# Patient Record
Sex: Female | Born: 1950 | Race: White | Hispanic: No | Marital: Married
Health system: Southern US, Community
[De-identification: ages and names within clinical notes are randomized; demographics above are authoritative.]

## PROBLEM LIST (undated history)

## (undated) DIAGNOSIS — R569 Unspecified convulsions: Secondary | ICD-10-CM

## (undated) DIAGNOSIS — Z9889 Other specified postprocedural states: Secondary | ICD-10-CM

## (undated) DIAGNOSIS — R51 Headache: Secondary | ICD-10-CM

## (undated) DIAGNOSIS — M1990 Unspecified osteoarthritis, unspecified site: Secondary | ICD-10-CM

## (undated) DIAGNOSIS — I4891 Unspecified atrial fibrillation: Secondary | ICD-10-CM

## (undated) DIAGNOSIS — R112 Nausea with vomiting, unspecified: Secondary | ICD-10-CM

## (undated) HISTORY — PX: ROTATOR CUFF REPAIR: SHX139

## (undated) HISTORY — DX: Unspecified osteoarthritis, unspecified site: M19.90

## (undated) HISTORY — PX: OTHER SURGICAL HISTORY: SHX169

## (undated) HISTORY — DX: Unspecified atrial fibrillation: I48.91

---

## 2003-05-06 ENCOUNTER — Other Ambulatory Visit: Admission: RE | Admit: 2003-05-06 | Discharge: 2003-05-06 | Payer: Self-pay | Admitting: Family Medicine

## 2003-05-21 ENCOUNTER — Encounter: Payer: Self-pay | Admitting: Family Medicine

## 2003-05-21 ENCOUNTER — Encounter: Admission: RE | Admit: 2003-05-21 | Discharge: 2003-05-21 | Payer: Self-pay | Admitting: Family Medicine

## 2004-05-05 ENCOUNTER — Other Ambulatory Visit: Admission: RE | Admit: 2004-05-05 | Discharge: 2004-05-05 | Payer: Self-pay | Admitting: Family Medicine

## 2004-05-23 ENCOUNTER — Encounter: Admission: RE | Admit: 2004-05-23 | Discharge: 2004-05-23 | Payer: Self-pay | Admitting: Family Medicine

## 2004-08-07 ENCOUNTER — Ambulatory Visit (HOSPITAL_COMMUNITY): Admission: RE | Admit: 2004-08-07 | Discharge: 2004-08-07 | Payer: Self-pay | Admitting: Gastroenterology

## 2004-12-11 ENCOUNTER — Encounter: Admission: RE | Admit: 2004-12-11 | Discharge: 2004-12-11 | Payer: Self-pay | Admitting: Family Medicine

## 2005-01-15 ENCOUNTER — Encounter: Admission: RE | Admit: 2005-01-15 | Discharge: 2005-02-08 | Payer: Self-pay | Admitting: Neurosurgery

## 2005-07-05 ENCOUNTER — Encounter: Admission: RE | Admit: 2005-07-05 | Discharge: 2005-07-05 | Payer: Self-pay | Admitting: Family Medicine

## 2005-09-05 ENCOUNTER — Other Ambulatory Visit: Admission: RE | Admit: 2005-09-05 | Discharge: 2005-09-05 | Payer: Self-pay | Admitting: Family Medicine

## 2006-07-30 ENCOUNTER — Encounter: Admission: RE | Admit: 2006-07-30 | Discharge: 2006-07-30 | Payer: Self-pay | Admitting: Family Medicine

## 2007-06-03 ENCOUNTER — Encounter: Admission: RE | Admit: 2007-06-03 | Discharge: 2007-06-03 | Payer: Self-pay | Admitting: Family Medicine

## 2007-08-18 ENCOUNTER — Encounter: Admission: RE | Admit: 2007-08-18 | Discharge: 2007-08-18 | Payer: Self-pay | Admitting: Family Medicine

## 2007-09-05 ENCOUNTER — Ambulatory Visit (HOSPITAL_COMMUNITY): Admission: RE | Admit: 2007-09-05 | Discharge: 2007-09-05 | Payer: Self-pay | Admitting: Neurosurgery

## 2007-09-09 ENCOUNTER — Other Ambulatory Visit: Admission: RE | Admit: 2007-09-09 | Discharge: 2007-09-09 | Payer: Self-pay | Admitting: Family Medicine

## 2007-12-17 ENCOUNTER — Observation Stay (HOSPITAL_COMMUNITY): Admission: RE | Admit: 2007-12-17 | Discharge: 2007-12-18 | Payer: Self-pay | Admitting: Neurosurgery

## 2008-06-20 ENCOUNTER — Encounter: Admission: RE | Admit: 2008-06-20 | Discharge: 2008-06-20 | Payer: Self-pay | Admitting: Neurosurgery

## 2008-07-07 ENCOUNTER — Encounter: Admission: RE | Admit: 2008-07-07 | Discharge: 2008-07-07 | Payer: Self-pay | Admitting: Family Medicine

## 2008-07-15 ENCOUNTER — Encounter: Admission: RE | Admit: 2008-07-15 | Discharge: 2008-07-15 | Payer: Self-pay | Admitting: Family Medicine

## 2008-08-09 ENCOUNTER — Encounter: Admission: RE | Admit: 2008-08-09 | Discharge: 2008-08-09 | Payer: Self-pay | Admitting: Orthopedic Surgery

## 2008-08-18 ENCOUNTER — Encounter: Admission: RE | Admit: 2008-08-18 | Discharge: 2008-08-18 | Payer: Self-pay | Admitting: Family Medicine

## 2008-08-23 ENCOUNTER — Encounter: Admission: RE | Admit: 2008-08-23 | Discharge: 2008-08-23 | Payer: Self-pay | Admitting: Orthopedic Surgery

## 2008-09-03 ENCOUNTER — Encounter: Admission: RE | Admit: 2008-09-03 | Discharge: 2008-09-03 | Payer: Self-pay | Admitting: Family Medicine

## 2008-09-20 ENCOUNTER — Other Ambulatory Visit: Admission: RE | Admit: 2008-09-20 | Discharge: 2008-09-20 | Payer: Self-pay | Admitting: Family Medicine

## 2009-03-10 ENCOUNTER — Encounter: Admission: RE | Admit: 2009-03-10 | Discharge: 2009-03-10 | Payer: Self-pay | Admitting: Orthopedic Surgery

## 2009-03-23 ENCOUNTER — Encounter: Admission: RE | Admit: 2009-03-23 | Discharge: 2009-03-23 | Payer: Self-pay | Admitting: Orthopedic Surgery

## 2009-04-04 ENCOUNTER — Encounter: Admission: RE | Admit: 2009-04-04 | Discharge: 2009-04-04 | Payer: Self-pay | Admitting: Orthopedic Surgery

## 2009-09-20 ENCOUNTER — Other Ambulatory Visit: Admission: RE | Admit: 2009-09-20 | Discharge: 2009-09-20 | Payer: Self-pay | Admitting: Family Medicine

## 2009-09-29 ENCOUNTER — Encounter: Admission: RE | Admit: 2009-09-29 | Discharge: 2009-09-29 | Payer: Self-pay | Admitting: Family Medicine

## 2009-12-03 ENCOUNTER — Ambulatory Visit: Payer: Self-pay | Admitting: Diagnostic Radiology

## 2009-12-03 ENCOUNTER — Emergency Department (HOSPITAL_BASED_OUTPATIENT_CLINIC_OR_DEPARTMENT_OTHER): Admission: EM | Admit: 2009-12-03 | Discharge: 2009-12-03 | Payer: Self-pay | Admitting: Emergency Medicine

## 2010-05-24 ENCOUNTER — Ambulatory Visit: Payer: Self-pay | Admitting: Vascular Surgery

## 2010-05-24 ENCOUNTER — Ambulatory Visit (HOSPITAL_COMMUNITY): Admission: RE | Admit: 2010-05-24 | Discharge: 2010-05-24 | Payer: Self-pay | Admitting: Neurosurgery

## 2010-09-21 ENCOUNTER — Other Ambulatory Visit: Admission: RE | Admit: 2010-09-21 | Discharge: 2010-09-21 | Payer: Self-pay | Admitting: Family Medicine

## 2011-01-22 ENCOUNTER — Encounter: Payer: Self-pay | Admitting: Family Medicine

## 2011-05-15 NOTE — Op Note (Signed)
NAMESOTIRIA, KEAST NO.:  [id number]   MEDICAL RECORD NO.:  [id number]          PATIENT TYPE:  INP   LOCATION:  3536                         FACILITY:  [facility]   PHYSICIAN:  Cristi Loron, M.D.DATE OF BIRTH:  09/10/1951   DATE OF PROCEDURE:  12/17/2007  DATE OF DISCHARGE:  12/18/2007                               OPERATIVE REPORT   BRIEF HISTORY:  The patient is a 60 year old white female who suffered  from neck and arm pain.  She failed medical management, was worked up  with a cervical MRI and myelo-CT, which demonstrated the patient had  spondylosis/stenosis at C4-5 and C5-6.  I discussed the various  treatment options with the patient and her family, including surgery.  She has weighed the risks, benefits, and alternatives of the surgery.  She opted to pursue with the C4-5 and C5-6 anterior cervical diskectomy,  fusion, and plating.   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   PROCEDURE:  C4-5 and C5-6 extensive anterior cervical  diskectomy/decompression; C4-5, C5-6 anterior interbody arthrodesis with  local autograft bone and VITOSS bone-graft extender; the insertion of C4-  5, C5-6 interbody prosthesis (Alphatec PEEK interbody prosthesis); C4 to  C6 anterior cervical plating with Codman Slim-Loc titanium plate and  screws.   SURGEON:  Dr. Tressie Stalker.   ASSISTANT:  Dr. Trey Sailors.   ANESTHESIA:  General endotracheal.   ESTIMATED BLOOD LOSS:  100 mL.   SPECIMENS:  None.   DRAINS:  None.   COMPLICATIONS:  None.   PROCEDURE:  The patient was brought to operating by anesthesia team.  General endotracheal anesthesia was induced.  The patient remained in  the supine position.  A roll was placed under her shoulders to place her  neck in slight  extension.  The anterior cervical region was then  prepared with Betadine scrub and Betadine solution.  Sterile drapes were  applied.  I then injected the area to be incised with Marcaine with  epinephrine solution.  I used a scalpel to make a transverse incision in  the patient's left anterior neck.  I used the Metzenbaum scissors to  divide the platysma muscle and then to dissect medial to  sternocleidomastoid muscle, jugular vein, and carotid artery.  I  carefully dissected down towards the anterior cervical spine, identified  the esophagus, retracted it medially.  I then used the Kitner swabs to  clear the soft tissue from the anterior cervical spine and inserted a  bent spinal needle into the upper exposed intervertebral disk space.  I  obtained intraoperative radiograph to confirm our location.   We then used electrocautery to detach the medial border of the longus  colli muscle bilaterally from the C4-5 and C5-6 intervertebral disk  space.  I inserted the Caspar self-retaining retractor for exposure.  We  began at C4-5; I incised the C4-5 intervertebral disk with a 15-blade  scalpel and performed partial intervertebral diskectomy with a pituitary  forceps.  The  disk space was collapsed and spondylotic.  I then inserted  distraction screws to C4 and C5 and then brought the operative  microscope into the field.  Under image magnification illumination, we  completed the decompression.  We used a high-speed drill to decorticate  the vertebral endplates at C4-5, drilled away the remainder C4-5  intervertebral disk, and then drilled away some posterior spondylosis  and thinned out the posterior longitudinal ligament, and incised the  ligament with arachnoid knife and removed it with Kerrison punch,  undercutting a vertebral endplates at C4-5, decompressing thecal sac.  I  performed a foraminotomy about the bilateral C5 nerve root completing  the decompression at this level.   I then  repeated this procedure in an analogous fashion at C5-6,  decompressing the C5-6 thecal sac and bilateral C6 nerve roots.   Having completed the decompression, we now turned our attention to  arthrodesis.  We used trial spacers and determined to use a 5-mm small  prosthesis at C5-6 and a 6-mm prosthesis at C4-5.  We prefilled these  small Alphatec PEEK interbody prostheses with local autograft bone we  obtained during decompression as well as VITOSS bone-graft extender.  We  then inserted the prosthesis and distracted the interspaces and then  removed the distraction screws.  There was a good snug fit of the  prosthesis at both levels.   We now turned our attention to the anterior instrumentation.  We used a  high-speed drill to remove some ventral spondylosis from the vertebral  endplates at C4-5 and C5-6, so that the plate would lay down flat.  We  selected appropriate length Codman Slim-Loc anterior cervical plate and  laid along the anterior aspect of vertebral bodies from C4 to C6.  We  then drilled two 14-mm holes at C4, C5, and C6 and then secured the  plates to the vertebral bodies by placing two 12-mm self-tapping screws  at C4, C5, and C6.  We then obtained intraoperative radiograph  demonstrating good position of plate, screws, and interbody prosthesis.  We therefore secured the screws to the plate by locking each cam.   We then obtained hemostasis using bipolar electrocautery.  We irrigated  the wound out with bacitracin solution.  We then removed the retractor.  We inspected the esophagus for any damage, there was none apparent.  We  then reapproximated the patient's platysma muscle with interrupted 3-0  Vicryl suture, subcutaneous tissue with interrupted 3-0 Vicryl suture,  and the skin with Steri-Strips and Benzoin.  The wound was then coated  with bacitracin ointment.  A sterile dressing was applied.  Drapes were  removed.  The patient was subsequently extubated by the  anesthesia team  and transported to the post anesthesia care unit in stable condition.  All sponge, instrument, and needle counts were correct at the end of  this case.      Cristi Loron, M.D.  Electronically Signed     JDJ/MEDQ  D:  12/18/2007  T:  12/19/2007  Job:  [id number]

## 2011-05-18 NOTE — Op Note (Signed)
NAME:  Allison Burnett, Allison Burnett                   ACCOUNT NO.:  [id number]   MEDICAL RECORD NO.:  [id number]                   PATIENT TYPE:  AMB   LOCATION:  ENDO                                 FACILITY:  [facility]   PHYSICIAN:  Danise Edge, M.D.                DATE OF BIRTH:  10-18-1951   DATE OF PROCEDURE:  08/07/2004  DATE OF DISCHARGE:                                 OPERATIVE REPORT   PROCEDURE:  Screening colonoscopy.   PROCEDURE INDICATION:  [name] is a 60 year old female,  born 04-16-1951.  Approximately 5 years ago, Ms. Colon underwent a  diagnostic colonoscopy to evaluate iron deficiency anemia and a positive  family history of colon polyps in her brother and other.  A 3 mm polyp was  removed from the sigmoid colon.  [name] was advised to have a repeat  colonoscopy in 2005.   ENDOSCOPIST:  Danise Edge, M.D.   PREMEDICATION:  1. Versed 7 mg.  2. Demerol 50 mg.   DESCRIPTION OF PROCEDURE:  After obtaining informed consent, [name] was  placed in the left lateral decubitus position.  I administered intravenous  Demerol and intravenous Versed to achieve conscious sedation for the  procedure.  The patient's blood pressure, oxygen saturation, and cardiac  rhythm were monitored throughout the procedure and documented in the medical  record.   Anal inspection and digital rectal exam were normal.  The Olympus adjustable  pediatric colonoscope was introduced into the rectum and advanced to the  cecum.  Colonic preparation for the exam today was excellent.   RECTUM:  Normal.  SIGMOID COLON AND DESCENDING COLON:  Normal.  SPLENIC FLEXURE:  Normal.  TRANSVERSE COLON:  Normal.  HEPATIC FLEXURE:  Normal.  ASCENDING COLON:  Normal.  CECUM AND ILEOCECAL VALVE:  Normal.   ASSESSMENT:  Normal proctocolonoscopy to the cecum.   RECOMMENDATIONS:  Repeat colonoscopy in August 2010.                                               Danise Edge,  M.D.    MJ/MEDQ  D:  08/07/2004  T:  08/07/2004  Job:  [id number]   cc:   Gretta Arab. Valentina Lucks, M.D.  [address] [place]  [place] [address]  Fax: [phone]

## 2011-10-05 LAB — BASIC METABOLIC PANEL
BUN: 7
CO2: 26
Calcium: 9.6
Chloride: 107
Creatinine, Ser: 0.72
GFR calc Af Amer: >60
GFR calc non Af Amer: >60
Glucose, Bld: 85
Potassium: 4.9
Sodium: 137

## 2011-10-05 LAB — CBC
MCHC: 34
MCV: 97.6
RBC: 3.77 — ABNORMAL LOW

## 2012-02-07 ENCOUNTER — Other Ambulatory Visit: Payer: Self-pay | Admitting: Orthopedic Surgery

## 2012-02-07 DIAGNOSIS — M25562 Pain in left knee: Secondary | ICD-10-CM

## 2012-02-12 ENCOUNTER — Ambulatory Visit
Admission: RE | Admit: 2012-02-12 | Discharge: 2012-02-12 | Disposition: A | Discharge status: Home / Self Care | Payer: BC Managed Care – PPO | Source: Ambulatory Visit | Attending: Orthopedic Surgery | Admitting: Orthopedic Surgery

## 2012-02-12 DIAGNOSIS — M25562 Pain in left knee: Secondary | ICD-10-CM

## 2012-09-26 ENCOUNTER — Other Ambulatory Visit: Payer: Self-pay | Admitting: Family Medicine

## 2012-09-26 ENCOUNTER — Other Ambulatory Visit (HOSPITAL_COMMUNITY)
Admission: RE | Admit: 2012-09-26 | Discharge: 2012-09-26 | Disposition: A | Discharge status: Home / Self Care | Payer: BC Managed Care – PPO | Source: Ambulatory Visit | Attending: Family Medicine | Admitting: Family Medicine

## 2012-09-26 DIAGNOSIS — Z124 Encounter for screening for malignant neoplasm of cervix: Secondary | ICD-10-CM | POA: Insufficient documentation

## 2013-06-30 HISTORY — PX: ABDOMINAL HYSTERECTOMY: SHX81

## 2014-01-04 DIAGNOSIS — R112 Nausea with vomiting, unspecified: Secondary | ICD-10-CM

## 2014-01-04 DIAGNOSIS — Z9889 Other specified postprocedural states: Secondary | ICD-10-CM

## 2014-01-04 HISTORY — DX: Nausea with vomiting, unspecified: R11.2

## 2014-01-04 HISTORY — DX: Other specified postprocedural states: Z98.890

## 2014-02-09 ENCOUNTER — Other Ambulatory Visit: Payer: Self-pay | Admitting: Gastroenterology

## 2014-02-10 ENCOUNTER — Encounter (HOSPITAL_COMMUNITY): Payer: Self-pay | Admitting: *Deleted

## 2014-02-10 ENCOUNTER — Encounter (HOSPITAL_COMMUNITY): Payer: Self-pay | Admitting: Pharmacy Technician

## 2014-04-07 ENCOUNTER — Encounter (HOSPITAL_COMMUNITY): Payer: Self-pay | Admitting: *Deleted

## 2014-05-19 ENCOUNTER — Encounter (HOSPITAL_COMMUNITY): Payer: Self-pay | Admitting: *Deleted

## 2014-05-31 ENCOUNTER — Other Ambulatory Visit: Payer: Self-pay | Admitting: Gastroenterology

## 2014-06-01 ENCOUNTER — Encounter (HOSPITAL_COMMUNITY)
Admission: RE | Disposition: A | Discharge status: Home / Self Care | Payer: Self-pay | Source: Ambulatory Visit | Attending: Gastroenterology

## 2014-06-01 ENCOUNTER — Encounter (HOSPITAL_COMMUNITY): Payer: Self-pay

## 2014-06-01 ENCOUNTER — Ambulatory Visit (HOSPITAL_COMMUNITY)
Admission: RE | Admit: 2014-06-01 | Discharge: 2014-06-01 | Disposition: A | Discharge status: Home / Self Care | Payer: BC Managed Care – PPO | Source: Ambulatory Visit | Attending: Gastroenterology | Admitting: Gastroenterology

## 2014-06-01 ENCOUNTER — Ambulatory Visit (HOSPITAL_COMMUNITY): Payer: BC Managed Care – PPO | Admitting: Anesthesiology

## 2014-06-01 ENCOUNTER — Encounter (HOSPITAL_COMMUNITY): Payer: BC Managed Care – PPO | Admitting: Anesthesiology

## 2014-06-01 DIAGNOSIS — M810 Age-related osteoporosis without current pathological fracture: Secondary | ICD-10-CM | POA: Insufficient documentation

## 2014-06-01 DIAGNOSIS — Z1211 Encounter for screening for malignant neoplasm of colon: Secondary | ICD-10-CM | POA: Insufficient documentation

## 2014-06-01 DIAGNOSIS — L719 Rosacea, unspecified: Secondary | ICD-10-CM | POA: Insufficient documentation

## 2014-06-01 DIAGNOSIS — G2581 Restless legs syndrome: Secondary | ICD-10-CM | POA: Insufficient documentation

## 2014-06-01 DIAGNOSIS — Z888 Allergy status to other drugs, medicaments and biological substances status: Secondary | ICD-10-CM | POA: Insufficient documentation

## 2014-06-01 DIAGNOSIS — Z882 Allergy status to sulfonamides status: Secondary | ICD-10-CM | POA: Diagnosis not present

## 2014-06-01 DIAGNOSIS — M1990 Unspecified osteoarthritis, unspecified site: Secondary | ICD-10-CM | POA: Insufficient documentation

## 2014-06-01 HISTORY — DX: Headache: R51

## 2014-06-01 HISTORY — DX: Nausea with vomiting, unspecified: R11.2

## 2014-06-01 HISTORY — DX: Unspecified convulsions: R56.9

## 2014-06-01 HISTORY — PX: COLONOSCOPY WITH PROPOFOL: SHX5780

## 2014-06-01 HISTORY — DX: Other specified postprocedural states: Z98.890

## 2014-06-01 HISTORY — DX: Unspecified osteoarthritis, unspecified site: M19.90

## 2014-06-01 SURGERY — COLONOSCOPY WITH PROPOFOL
Anesthesia: Monitor Anesthesia Care

## 2014-06-01 SURGERY — COLONOSCOPY
Anesthesia: Moderate Sedation

## 2014-06-01 MED ORDER — PROPOFOL INFUSION 10 MG/ML OPTIME
INTRAVENOUS | Status: DC | PRN
  Administered 2014-06-01: 300 ug/kg/min via INTRAVENOUS

## 2014-06-01 MED ORDER — LACTATED RINGERS IV SOLN
INTRAVENOUS | Status: DC
  Administered 2014-06-01: 11:00:00 via INTRAVENOUS

## 2014-06-01 MED ORDER — PROPOFOL 10 MG/ML IV BOLUS
INTRAVENOUS | Status: AC
  Filled 2014-06-01: qty 60

## 2014-06-01 MED ORDER — PROMETHAZINE HCL 25 MG/ML IJ SOLN: 6.2500 mg | INTRAMUSCULAR | Status: DC | PRN

## 2014-06-01 SURGICAL SUPPLY — 22 items

## 2014-06-01 NOTE — Transfer of Care (Signed)
Immediate Anesthesia Transfer of Care Note  Patient: Allison Burnett  Procedure(s) Performed: Procedure(s): COLONOSCOPY WITH PROPOFOL (N/A)  Patient Location: PACU  Anesthesia Type:MAC  Level of Consciousness: awake and patient cooperative  Airway & Oxygen Therapy: Patient Spontanous Breathing and Patient connected to face mask oxygen  Post-op Assessment: Report given to PACU RN and Post -op Vital signs reviewed and stable  Post vital signs: Reviewed and stable  Complications: No apparent anesthesia complications

## 2014-06-01 NOTE — Anesthesia Postprocedure Evaluation (Signed)
  Anesthesia Post-op Note  Patient: Allison Burnett  Procedure(s) Performed: Procedure(s) (LRB): COLONOSCOPY WITH PROPOFOL (N/A)  Patient Location: PACU  Anesthesia Type: MAC  Level of Consciousness: awake and alert   Airway and Oxygen Therapy: Patient Spontanous Breathing  Post-op Pain: mild  Post-op Assessment: Post-op Vital signs reviewed, Patient's Cardiovascular Status Stable, Respiratory Function Stable, Patent Airway and No signs of Nausea or vomiting  Last Vitals:  Filed Vitals:   06/01/14 1216  BP: 123/83  Pulse: 88  Temp:   Resp: 15    Post-op Vital Signs: stable   Complications: No apparent anesthesia complications

## 2014-06-01 NOTE — Op Note (Signed)
Procedure: Screening colonoscopy. Normal screening colonoscopy performed 02/15/2004  Endoscopist: Danise Edge  Premedication: Propofol administered by anesthesia  Procedure: The patient was placed in the left lateral decubitus position. Anal inspection and digital rectal exam were normal. The Pentax pediatric colonoscope was introduced into the rectum and advanced to the cecum. A normal-appearing ileocecal valve and appendiceal orifice were identified. Colonic preparation for the exam today was good.  Rectum. Normal. Retroflexed view of the distal rectum normal  Sigmoid colon and descending colon. Normal  Splenic flexure. Normal  Transverse colon. Normal  Hepatic flexure. Normal  Ascending colon. Normal  Cecum and ileocecal valve. Normal  Assessment: Normal screening proctocolonoscopy to the cecum  Recommendation: Schedule repeat screening colonoscopy in 10 years

## 2014-06-01 NOTE — H&P (Signed)
  Procedure: Screening colonoscopy. Normal screening colonoscopy performed 02/15/2004.  History: The patient is a 63 year old female born 07/29/1951. She is scheduled to undergo a repeat screening colonoscopy with polypectomy to prevent colon cancer.  Past medical history: Seizures as a child. Osteoarthritis. Restless leg syndrome. Osteoporosis with wrist fracture. Rosacea. Hysterectomy. Cervical spine surgery.  Allergies: Mysoline. Sulfa drugs. Dilantin. Phenobarbital.  Exam: The patient is alert and lying comfortably on the endoscopy stretcher. Lungs are clear to auscultation. Cardiac exam reveals a regular rhythm. Abdomen is soft and nontender to palpation.  Plan: Proceed with screening colonoscopy

## 2014-06-01 NOTE — Anesthesia Preprocedure Evaluation (Signed)
Anesthesia Evaluation  Patient identified by MRN, date of birth, ID band Patient awake    Reviewed: Allergy & Precautions, H&P , NPO status , Patient's Chart, lab work & pertinent test results  Airway Mallampati: II TM Distance: >3 FB Neck ROM: Full    Dental no notable dental hx.    Pulmonary neg pulmonary ROS,  breath sounds clear to auscultation  Pulmonary exam normal       Cardiovascular negative cardio ROS  Rhythm:Regular Rate:Normal     Neuro/Psych negative neurological ROS  negative psych ROS   GI/Hepatic negative GI ROS, Neg liver ROS,   Endo/Other  negative endocrine ROS  Renal/GU negative Renal ROS  negative genitourinary   Musculoskeletal negative musculoskeletal ROS (+)   Abdominal   Peds negative pediatric ROS (+)  Hematology negative hematology ROS (+)   Anesthesia Other Findings   Reproductive/Obstetrics negative OB ROS                           Anesthesia Physical Anesthesia Plan  ASA: I  Anesthesia Plan: MAC   Post-op Pain Management:    Induction: Intravenous  Airway Management Planned: Nasal Cannula  Additional Equipment:   Intra-op Plan:   Post-operative Plan:   Informed Consent: I have reviewed the patients History and Physical, chart, labs and discussed the procedure including the risks, benefits and alternatives for the proposed anesthesia with the patient or authorized representative who has indicated his/her understanding and acceptance.     Plan Discussed with: CRNA and Surgeon  Anesthesia Plan Comments:         Anesthesia Quick Evaluation  

## 2014-06-03 ENCOUNTER — Encounter (HOSPITAL_COMMUNITY): Payer: Self-pay | Admitting: Gastroenterology

## 2015-09-20 ENCOUNTER — Other Ambulatory Visit (HOSPITAL_BASED_OUTPATIENT_CLINIC_OR_DEPARTMENT_OTHER): Payer: Self-pay | Admitting: Podiatry

## 2015-09-20 DIAGNOSIS — M79672 Pain in left foot: Secondary | ICD-10-CM

## 2015-09-24 ENCOUNTER — Ambulatory Visit (HOSPITAL_BASED_OUTPATIENT_CLINIC_OR_DEPARTMENT_OTHER)
Admission: RE | Admit: 2015-09-24 | Discharge: 2015-09-24 | Disposition: A | Discharge status: Home / Self Care | Payer: BLUE CROSS/BLUE SHIELD | Source: Ambulatory Visit | Attending: Podiatry | Admitting: Podiatry

## 2015-09-24 DIAGNOSIS — M79672 Pain in left foot: Secondary | ICD-10-CM | POA: Diagnosis present

## 2016-06-18 ENCOUNTER — Other Ambulatory Visit: Payer: Self-pay | Admitting: Family Medicine

## 2016-06-18 ENCOUNTER — Ambulatory Visit
Admission: RE | Admit: 2016-06-18 | Discharge: 2016-06-18 | Disposition: A | Discharge status: Home / Self Care | Payer: BLUE CROSS/BLUE SHIELD | Source: Ambulatory Visit | Attending: Family Medicine | Admitting: Family Medicine

## 2016-06-18 DIAGNOSIS — R059 Cough, unspecified: Secondary | ICD-10-CM

## 2016-06-18 DIAGNOSIS — R05 Cough: Secondary | ICD-10-CM

## 2016-06-19 ENCOUNTER — Other Ambulatory Visit (HOSPITAL_COMMUNITY): Payer: Self-pay | Admitting: Respiratory Therapy

## 2016-06-19 DIAGNOSIS — J45909 Unspecified asthma, uncomplicated: Secondary | ICD-10-CM

## 2016-06-25 ENCOUNTER — Ambulatory Visit (HOSPITAL_COMMUNITY)
Admission: RE | Admit: 2016-06-25 | Discharge: 2016-06-25 | Disposition: A | Discharge status: Home / Self Care | Payer: BLUE CROSS/BLUE SHIELD | Source: Ambulatory Visit | Attending: Family Medicine | Admitting: Family Medicine

## 2016-06-25 DIAGNOSIS — J45909 Unspecified asthma, uncomplicated: Secondary | ICD-10-CM | POA: Insufficient documentation

## 2016-06-25 LAB — PULMONARY FUNCTION TEST
DL/VA % pred: 103 %
DL/VA: 4.82 ml/min/mmHg/L
DLCO UNC % PRED: 79 %
DLCO UNC: 18.1 ml/min/mmHg
FEF 25-75 PRE: 1.83 L/s
FEF 25-75 Post: 1.67 L/sec
FEF2575-%Change-Post: -9 %
FEF2575-%Pred-Post: 80 %
FEF2575-%Pred-Pre: 89 %
FEV1-%CHANGE-POST: -3 %
FEV1-%Pred-Post: 79 %
FEV1-%Pred-Pre: 82 %
FEV1-POST: 1.85 L
FEV1-Pre: 1.91 L
FEV1FVC-%Change-Post: 0 %
FEV1FVC-%PRED-PRE: 103 %
FEV6-%CHANGE-POST: -3 %
FEV6-%PRED-POST: 78 %
FEV6-%Pred-Pre: 81 %
FEV6-Post: 2.29 L
FEV6-Pre: 2.38 L
FEV6FVC-%CHANGE-POST: 0 %
FEV6FVC-%Pred-Post: 104 %
FEV6FVC-%Pred-Pre: 103 %
FVC-%CHANGE-POST: -4 %
FVC-%Pred-Post: 75 %
FVC-%Pred-Pre: 78 %
FVC-Post: 2.29 L
FVC-Pre: 2.39 L
POST FEV6/FVC RATIO: 100 %
Post FEV1/FVC ratio: 81 %
Pre FEV1/FVC ratio: 80 %
Pre FEV6/FVC Ratio: 100 %
RV % PRED: 101 %
RV: 2.07 L
TLC % pred: 91 %
TLC: 4.49 L

## 2016-06-25 MED ORDER — ALBUTEROL SULFATE (2.5 MG/3ML) 0.083% IN NEBU
2.5000 mg | INHALATION_SOLUTION | Freq: Once | RESPIRATORY_TRACT | Status: AC
  Administered 2016-06-25: 2.5 mg via RESPIRATORY_TRACT

## 2016-08-14 ENCOUNTER — Ambulatory Visit (INDEPENDENT_AMBULATORY_CARE_PROVIDER_SITE_OTHER): Payer: BLUE CROSS/BLUE SHIELD | Admitting: Pulmonary Disease

## 2016-08-14 ENCOUNTER — Other Ambulatory Visit (INDEPENDENT_AMBULATORY_CARE_PROVIDER_SITE_OTHER): Payer: BLUE CROSS/BLUE SHIELD

## 2016-08-14 ENCOUNTER — Encounter: Payer: Self-pay | Admitting: Pulmonary Disease

## 2016-08-14 DIAGNOSIS — R05 Cough: Secondary | ICD-10-CM

## 2016-08-14 DIAGNOSIS — M1990 Unspecified osteoarthritis, unspecified site: Secondary | ICD-10-CM

## 2016-08-14 DIAGNOSIS — I4891 Unspecified atrial fibrillation: Secondary | ICD-10-CM | POA: Insufficient documentation

## 2016-08-14 DIAGNOSIS — Z8669 Personal history of other diseases of the nervous system and sense organs: Secondary | ICD-10-CM | POA: Insufficient documentation

## 2016-08-14 DIAGNOSIS — R059 Cough, unspecified: Secondary | ICD-10-CM

## 2016-08-14 DIAGNOSIS — G40909 Epilepsy, unspecified, not intractable, without status epilepticus: Secondary | ICD-10-CM | POA: Insufficient documentation

## 2016-08-14 DIAGNOSIS — J3081 Allergic rhinitis due to animal (cat) (dog) hair and dander: Secondary | ICD-10-CM | POA: Insufficient documentation

## 2016-08-14 DIAGNOSIS — J302 Other seasonal allergic rhinitis: Secondary | ICD-10-CM

## 2016-08-14 LAB — SEDIMENTATION RATE: Sed Rate: 17 mm/hr (ref 0–30)

## 2016-08-14 LAB — RHEUMATOID FACTOR

## 2016-08-14 LAB — C-REACTIVE PROTEIN: CRP: 1.3 mg/dL (ref 0.5–20.0)

## 2016-08-14 MED ORDER — ALBUTEROL SULFATE HFA 108 (90 BASE) MCG/ACT IN AERS: 2.0000 | INHALATION_SPRAY | RESPIRATORY_TRACT | 1 refills | Status: DC | PRN

## 2016-08-14 MED ORDER — PREDNISONE 20 MG PO TABS: 40.0000 mg | ORAL_TABLET | Freq: Every day | ORAL | 0 refills | Status: DC

## 2016-08-14 MED ORDER — AMOXICILLIN-POT CLAVULANATE 875-125 MG PO TABS: 1.0000 | ORAL_TABLET | Freq: Two times a day (BID) | ORAL | 0 refills | Status: DC

## 2016-08-14 NOTE — Progress Notes (Signed)
Subjective:    Patient ID: Allison Burnett, female    DOB: [birth year], 65 y.o.   MRN: [id number]  HPI Per documentation patient was seen on 06/18/16 with 2 days of cough and chest congestion as well as a sore throat. Patient has a history of chronic cough reportedly treated as reflux with Prilosec without significant improvement. At that time patient was reporting cough is worse at night as well as after eating and with laughter. She was treated at this visit with a prednisone 40 mg course for 3 days as well as doxycycline twice daily for a 10 day course. She reports her cough during this episode she had spontaneous occurrence of a cough and the chest discomfort and sore throat came after the cough started. She report Robitussin-DM was the only thing that seemed to help her cough. She reports she was waking up at night coughing & still does intermittently. She denies any associated wheezing. Husband has reported wheezing while sleeping. She does cough intermittently throughout the day. Cough is primarily nonproductive. She does have dyspnea with significant exertion or lifting. She reports only rarely is her cough triggered by perfumes or strong odors. Denies any reflux or dyspepsia. She denies any morning brash water taste but does have a bad taste with her periodontal disease. She reports a rare pain in her chest that lasts for 3-4 minutes with "tightness" that makes her bend over but then spontaneously resolves. No associated nausea or emesis. No recent syncope or near syncope. She has had previous syncope evaluated by cardiology and found to have atrial fibrillation. She reports chronic pain in her ankles, knees, and wrists. She denies any significant stiffness in her hands. No joint erythema. Denies any breathing problems or chronic bronchitis as a child. She reports she did develop cat allergies with a pregnancy. Reports only mild seasonal allergic rhinitis with post-nasal drainage.   Review of Systems  No rashes or bruising. No fever, chills, or sweats. A pertinent 14 point review of systems is negative except as per the history of presenting illness.  Allergies  Allergen Reactions  . Mysoline [Primidone] Nausea And Vomiting  . Dilantin [Phenytoin Sodium Extended] Rash  . Phenobarbital Rash  . Sulfa Antibiotics Rash    Current Outpatient Prescriptions on File Prior to Visit  Medication Sig Dispense Refill  . Ascorbic Acid (VITAMIN C) 1000 MG tablet Take 1,000 mg by mouth daily.    . Probiotic Product (PHILLIPS COLON HEALTH PO) Take 1 capsule by mouth daily.    Marland Kitchen VITAMIN D, CHOLECALCIFEROL, PO Take 1 capsule by mouth daily.     No current facility-administered medications on file prior to visit.     Past Medical History:  Diagnosis Date  . Arthritis   . Atrial fibrillation ([place])   . DJD (degenerative joint disease)    bulging disc lower back  . Headache(784.0)    migraines  . PONV (postoperative nausea and vomiting) Jan 04, 2014   woke up during attempted colonscopy at [facility]  . Seizures ([place])    none since age 30, childhood only    Past Surgical History:  Procedure Laterality Date  . ABDOMINAL HYSTERECTOMY  06-2013   and bladder tach done  . COLONOSCOPY WITH PROPOFOL N/A 06/01/2014   Procedure: COLONOSCOPY WITH PROPOFOL;  Surgeon: Garlan Fair, MD;  Location: [facility] END[facility]COPY;  Service: Endoscopy;  Laterality: N/A;  . colonscopy     multiple times due to family hx of colon cancer  .  polyps removed from colon  in past  . [place]    . spurs removed from spine  5 yrs ago    Family History  Problem Relation Age of Onset  . Colon cancer Maternal Grandmother   . Emphysema Maternal Grandfather   . Colon cancer Maternal Uncle   . Emphysema Mother   . Stroke Father   . Lung cancer Other     Social History   Social History  . Marital status: Married    Spouse name: N/A  . Number of children: N/A  . Years of education: N/A   Social History Main Topics    . Smoking status: Passive Smoke Exposure - Never Smoker  . Smokeless tobacco: Never Used     Comment: Parents until 84 y.o. as well as other family members  & husband  . Alcohol use Yes     Comment: occasional  . Drug use: No  . Sexual activity: Not Currently   Other Topics Concern  . None   Social History Narrative   Originally from [place], [place]. 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 Has a cat currently. Remote bird exposure as a child. No mold or asbestos exposure. Enjoys watching baseball games & off-roading. No hot tub exposure.       Objective:   Physical Exam BP 132/70 (BP Location: Left Arm, Cuff Size: Normal)   Pulse 88   Ht _0  (1.6 m)   Wt 168 lb 9.6 oz (76.5 kg)   SpO2 98%   BMI 29.87 kg/m  General:  Awake. Alert. No acute distress. Mild central obesity. Integument:  Warm & dry. No rash on exposed skin. No bruising. Lymphatics:  No appreciated cervical or supraclavicular lymphadenoapthy. HEENT:  Moist mucus membranes. No oral ulcers. No scleral injection or icterus. No significant nasal turbinate swelling. Cardiovascular:  Regular rate. No edema. No appreciable JVD.  Pulmonary:  Good aeration & clear to auscultation bilaterally. Symmetric chest wall expansion. No accessory muscle use. Abdomen: Soft. Normal bowel sounds. Protuberant. Grossly nontender. Musculoskeletal:  Normal bulk and tone. Hand grip strength 5/5 bilaterally. No joint deformity or effusion appreciated. Neurological:  CN 2-12 grossly in tact. No meningismus. Moving all 4 extremities equally. Symmetric brachioradialis deep tendon reflexes. Psychiatric:  Mood and affect congruent. Speech normal rhythm, rate & tone.   PFT 06/25/16: FVC 2.39 L (78%) FEV1 1.91 L (82%) FEV1/FVC 0.80 FEF 25-70 04/30/1942 liters (89%) no bronchodilator response TLC 4.49 L (91%) RV 101% ERV 25% DLC uncorrected 79%  IMAGING CXR PA/LAT 06/18/16 (personally  reviewed by me): No focal opacity or nodule appreciated. No pleural effusion. Heart normal in size & mediastinum normal in contour.    Assessment & Plan:  65 y.o. female with history of chronic cough. Patient does have mild seasonal allergic rhinitis that could be contributing to her cough. With her history of environmental as well as Allergies I do question the possibility that this represents underlying asthma. As an alternative she may have airway involvement of a possible underlying autoimmune arthritis that warrants evaluation with at least serum testing. Certainly silent laryngo-esophageal reflux could be contributing but without symptomatic improvement on Prilosec this is somewhat less favored. I instructed the patient to notify me if she has any new breathing problems or worsening in her cough before her next appointment.  1. Cough: Checking methacholine challenge test. Checking esophagogram. 2. Arthritis: Checking serum ESR, CRP, ANA, anti-CCP, and  rheumatoid factor. 3. Seasonal allergic rhinitis: Checking serum RAST panel & sinus CT without contrast. 4. Follow-up: Patient to return to clinic in 4 weeks or sooner if needed.  Sonia Baller Ashok Cordia, M.D. [facility] Pager:  [phone] After 3pm or if no response, call [phone] 10:03 AM 08/14/16

## 2016-08-14 NOTE — Patient Instructions (Signed)
   Please call me if you have any breathing problems before your next appointment or your cough gets worse.  We will review your test results at your next appointment with me in 4 weeks.   TESTS ORDERED: 1. Methacholine Challenge Tests 2. Serum RAST Panel, ESR, CRP, ANA, Anti-CCP, & Rheumatoid Factor.  3. Esophagram 4. Sinus CT Ltd w/o Contrast

## 2016-08-15 ENCOUNTER — Ambulatory Visit (HOSPITAL_BASED_OUTPATIENT_CLINIC_OR_DEPARTMENT_OTHER)
Admission: RE | Admit: 2016-08-15 | Discharge: 2016-08-15 | Disposition: A | Discharge status: Home / Self Care | Payer: BLUE CROSS/BLUE SHIELD | Source: Ambulatory Visit | Attending: Pulmonary Disease | Admitting: Pulmonary Disease

## 2016-08-15 DIAGNOSIS — R05 Cough: Secondary | ICD-10-CM | POA: Insufficient documentation

## 2016-08-15 DIAGNOSIS — R059 Cough, unspecified: Secondary | ICD-10-CM

## 2016-08-15 DIAGNOSIS — J302 Other seasonal allergic rhinitis: Secondary | ICD-10-CM | POA: Insufficient documentation

## 2016-08-15 LAB — RESPIRATORY ALLERGY PROFILE REGION II ~~LOC~~
Allergen, A. alternata, m6: <0.1 kU/L
Allergen, C. Herbarum, M2: <0.1 kU/L
Allergen, Comm Silver Birch, t9: <0.1 kU/L
Allergen, D pternoyssinus,d7: <0.1 kU/L
Allergen, Mouse Urine Protein, e78: <0.1 kU/L
Allergen, Mulberry, t76: <0.1 kU/L
Allergen, Oak,t7: <0.1 kU/L
Allergen, P. notatum, m1: <0.1 kU/L
Aspergillus fumigatus, m3: <0.1 kU/L
Box Elder IgE: <0.1 kU/L
Cat Dander: 0.44 kU/L — ABNORMAL HIGH
Cockroach: <0.1 kU/L
Common Ragweed: <0.1 kU/L
D. farinae: <0.1 kU/L
Dog Dander: <0.1 kU/L
Elm IgE: <0.1 kU/L
IGE (IMMUNOGLOBULIN E), SERUM: 49 kU/L (ref <115)
Johnson Grass: <0.1 kU/L
Rough Pigweed  IgE: <0.1 kU/L
Sheep Sorrel IgE: <0.1 kU/L

## 2016-08-15 LAB — ANA: Anti Nuclear Antibody(ANA): NEGATIVE

## 2016-08-15 LAB — CYCLIC CITRUL PEPTIDE ANTIBODY, IGG

## 2016-08-21 ENCOUNTER — Ambulatory Visit (HOSPITAL_COMMUNITY)
Admission: RE | Admit: 2016-08-21 | Discharge: 2016-08-21 | Disposition: A | Discharge status: Home / Self Care | Payer: BLUE CROSS/BLUE SHIELD | Source: Ambulatory Visit | Attending: Pulmonary Disease | Admitting: Pulmonary Disease

## 2016-08-21 ENCOUNTER — Ambulatory Visit (HOSPITAL_COMMUNITY): Payer: BLUE CROSS/BLUE SHIELD

## 2016-08-21 DIAGNOSIS — R05 Cough: Secondary | ICD-10-CM

## 2016-08-21 DIAGNOSIS — R059 Cough, unspecified: Secondary | ICD-10-CM

## 2016-08-21 LAB — PULMONARY FUNCTION TEST
FEF 25-75 POST: 0.72 L/s
FEF 25-75 Pre: 2.05 L/sec
FEF2575-%Change-Post: -64 %
FEF2575-%PRED-POST: 35 %
FEF2575-%Pred-Pre: 99 %
FEV1-%CHANGE-POST: -18 %
FEV1-%PRED-PRE: 87 %
FEV1-%Pred-Post: 71 %
FEV1-POST: 1.65 L
FEV1-Pre: 2.02 L
FEV1FVC-%Change-Post: -6 %
FEV1FVC-%PRED-PRE: 104 %
FEV6-%Change-Post: -11 %
FEV6-%Pred-Post: 75 %
FEV6-%Pred-Pre: 85 %
FEV6-POST: 2.2 L
FEV6-PRE: 2.48 L
FEV6FVC-%CHANGE-POST: 1 %
FEV6FVC-%PRED-POST: 104 %
FEV6FVC-%PRED-PRE: 102 %
FVC-%CHANGE-POST: -12 %
FVC-%PRED-POST: 72 %
FVC-%PRED-PRE: 83 %
FVC-POST: 2.2 L
FVC-PRE: 2.51 L
PRE FEV6/FVC RATIO: 99 %
Post FEV1/FVC ratio: 75 %
Post FEV6/FVC ratio: 100 %
Pre FEV1/FVC ratio: 80 %

## 2016-08-21 MED ORDER — SODIUM CHLORIDE 0.9 % IN NEBU
3.0000 mL | INHALATION_SOLUTION | Freq: Once | RESPIRATORY_TRACT | Status: AC
  Administered 2016-08-21: 3 mL via RESPIRATORY_TRACT

## 2016-08-21 MED ORDER — METHACHOLINE 0.0625 MG/ML NEB SOLN
2.0000 mL | Freq: Once | RESPIRATORY_TRACT | Status: AC
  Administered 2016-08-21: 0.125 mg via RESPIRATORY_TRACT

## 2016-08-21 MED ORDER — METHACHOLINE 1 MG/ML NEB SOLN
2.0000 mL | Freq: Once | RESPIRATORY_TRACT | Status: AC
  Administered 2016-08-21: 2 mg via RESPIRATORY_TRACT

## 2016-08-21 MED ORDER — METHACHOLINE 0.25 MG/ML NEB SOLN
2.0000 mL | Freq: Once | RESPIRATORY_TRACT | Status: AC
  Administered 2016-08-21: 0.5 mg via RESPIRATORY_TRACT

## 2016-08-21 MED ORDER — ALBUTEROL SULFATE (2.5 MG/3ML) 0.083% IN NEBU
2.5000 mg | INHALATION_SOLUTION | Freq: Once | RESPIRATORY_TRACT | Status: AC
  Administered 2016-08-21: 2.5 mg via RESPIRATORY_TRACT

## 2016-08-21 MED ORDER — METHACHOLINE 16 MG/ML NEB SOLN
2.0000 mL | Freq: Once | RESPIRATORY_TRACT | Status: AC
  Administered 2016-08-21: 32 mg via RESPIRATORY_TRACT

## 2016-08-21 MED ORDER — METHACHOLINE 4 MG/ML NEB SOLN
2.0000 mL | Freq: Once | RESPIRATORY_TRACT | Status: AC
  Administered 2016-08-21: 8 mg via RESPIRATORY_TRACT

## 2016-08-27 ENCOUNTER — Telehealth: Payer: Self-pay | Admitting: Pulmonary Disease

## 2016-08-27 NOTE — Telephone Encounter (Signed)
Spoke with pt and advised of test results per Dr Jamison NeighborNestor.  Pt will keep f/u appt with Dr Jamison NeighborNestor

## 2016-09-12 ENCOUNTER — Encounter: Payer: Self-pay | Admitting: Pulmonary Disease

## 2016-09-12 ENCOUNTER — Ambulatory Visit (INDEPENDENT_AMBULATORY_CARE_PROVIDER_SITE_OTHER): Payer: BLUE CROSS/BLUE SHIELD | Admitting: Pulmonary Disease

## 2016-09-12 VITALS — BP 124/68 | HR 96 | Ht 63.0 in | Wt 173.6 lb

## 2016-09-12 DIAGNOSIS — J4530 Mild persistent asthma, uncomplicated: Secondary | ICD-10-CM | POA: Insufficient documentation

## 2016-09-12 DIAGNOSIS — J069 Acute upper respiratory infection, unspecified: Secondary | ICD-10-CM | POA: Insufficient documentation

## 2016-09-12 DIAGNOSIS — J309 Allergic rhinitis, unspecified: Secondary | ICD-10-CM | POA: Diagnosis not present

## 2016-09-12 DIAGNOSIS — J4520 Mild intermittent asthma, uncomplicated: Secondary | ICD-10-CM | POA: Diagnosis not present

## 2016-09-12 MED ORDER — MONTELUKAST SODIUM 10 MG PO TABS: 10.0000 mg | ORAL_TABLET | Freq: Every day | ORAL | 3 refills | Status: DC

## 2016-09-12 NOTE — Progress Notes (Signed)
Subjective:    Patient ID: Allison Burnett, female    DOB: 03/28/1951, 65 y.o.   MRN: [id number]  C.C.:  Follow-up for Cough & Seasonal Allergic Rhinitis.  HPI Cough:  Patient's methacholine challenge test suggested abnormal bronchial hyperreactivity consistent with asthma. She did have worsening in her cough over the weekend with sinus congestion. She has also noticed increased wheezing. Coughing is keeping her awake at night. She was seen in her PCP's office and prescribed a cough medication and instructed to take Mucinex.   Seasonal Allergic Rhinitis:  She reports over the weekend she had more sinus congestion & post-nasal drainage. She denies any current facial pain but has been having pain. Currently she is taking OTC Equate Allergy medication.   Review of Systems Low grade fever at 100F. No chills or sweats. She reports she is having chest pain with her coughing. Denies any chest tightness otherwise. No nausea or emesis.   Allergies  Allergen Reactions  . Mysoline [Primidone] Nausea And Vomiting  . Dilantin [Phenytoin Sodium Extended] Rash  . Phenobarbital Rash  . Sulfa Antibiotics Rash    Current Outpatient Prescriptions on File Prior to Visit  Medication Sig Dispense Refill  . Ascorbic Acid (VITAMIN C) 1000 MG tablet Take 1,000 mg by mouth daily.    . calcium carbonate (CALCIUM 600) 600 MG TABS tablet Take 1,200 mg by mouth daily with breakfast.    . Cyanocobalamin (VITAMIN B 12 PO) Take by mouth. V b 12 injection every 2 weeks    . gabapentin (NEURONTIN) 400 MG capsule Take 400 mg by mouth 3 (three) times daily.    . meloxicam (MOBIC) 15 MG tablet Take 15 mg by mouth daily.    Marland Kitchen omeprazole (PRILOSEC) 40 MG capsule Take 40 mg by mouth 3 times/day as needed-between meals & bedtime.     . Probiotic Product (PHILLIPS COLON HEALTH PO) Take 1 capsule by mouth daily.    . rivaroxaban (XARELTO) 20 MG TABS tablet Take 20 mg by mouth daily with supper.    Marland Kitchen tiZANidine (ZANAFLEX) 4 MG  tablet Take 4 mg by mouth every 6 (six) hours as needed for muscle spasms.    Marland Kitchen venlafaxine (EFFEXOR) 75 MG tablet Take 75 mg by mouth 2 (two) times daily.    Marland Kitchen VITAMIN D, CHOLECALCIFEROL, PO Take 1 capsule by mouth daily.     No current facility-administered medications on file prior to visit.     Past Medical History:  Diagnosis Date  . Arthritis   . Atrial fibrillation ([place])   . DJD (degenerative joint disease)    bulging disc lower back  . Headache(784.0)    migraines  . PONV (postoperative nausea and vomiting) Jan 04, 2014   woke up during attempted colonscopy at [facility]  . Seizures ([place])    none since age 40, childhood only    Past Surgical History:  Procedure Laterality Date  . ABDOMINAL HYSTERECTOMY  06-2013   and bladder tach done  . COLONOSCOPY WITH PROPOFOL N/A 06/01/2014   Procedure: COLONOSCOPY WITH PROPOFOL;  Surgeon: Garlan Fair, MD;  Location: [facility];  Service: Endoscopy;  Laterality: N/A;  . colonscopy     multiple times due to family hx of colon cancer  . polyps removed from colon  in past  . [place]    . spurs removed from spine  5 yrs ago    Family History  Problem Relation Age of Onset  . Colon cancer  Maternal Grandmother   . Emphysema Maternal Grandfather   . Colon cancer Maternal Uncle   . Emphysema Mother   . Stroke Father   . Lung cancer Other     Social History   Social History  . Marital status: Married    Spouse name: N/A  . Number of children: N/A  . Years of education: N/A   Social History Main Topics  . Smoking status: Passive Smoke Exposure - Never Smoker  . Smokeless tobacco: Never Used     Comment: Parents until 84 y.o. as well as other family members  & husband  . Alcohol use Yes     Comment: occasional  . Drug use: No  . Sexual activity: Not Currently   Other Topics Concern  . None   Social History Narrative   Originally from [place], [place]. 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 Has a cat currently. Remote bird exposure as a child. No mold or asbestos exposure. Enjoys watching baseball games & off-roading. No hot tub exposure.       Objective:   Physical Exam BP 124/68 (BP Location: Left Arm, Cuff Size: Normal)   Pulse 96   Ht _0  (1.6 m)   Wt 173 lb 9.6 oz (78.7 kg)   SpO2 98%   BMI 30.75 kg/m  General:  Awake. Alert. No distress. Mild central obesity. Integument:  Warm & dry. No rash on exposed skin. Lymphatics:  No appreciated cervical or supraclavicular lymphadenoapthy. HEENT:  Moist mucus membranes. Mild bilateral nasal turbinate swelling with erythema. No sinus tenderness to palpation. Cardiovascular:  Regular rate. No edema. Normal S1 & S2. Pulmonary: Normal work of breathing on room air. Speaking in complete sentences. Overall clear to auscultation.. Abdomen: Soft. Normal bowel sounds. Protuberant.   PFT 06/25/16: FVC 2.39 L (78%) FEV1 1.91 L (82%) FEV1/FVC 0.80 FEF 25-75 1.83 L (89%) no bronchodilator response TLC 4.49 L (91%) RV 101% ERV 25% DLC uncorrected 79%  METHACHOLINE CHALLENGE TESTING (08/21/16): FEV1/FVC 0.80 pre-methacholine challenge. 33% reduction in FEV1 at 55 dilution. Significant bronchodilator response subsequent.  IMAGING BARIUM SWALLOW 08/21/16 (per radiologist): No evidence of mass, stricture, mucosal ulceration, or esophageal dysmotility.  MAXILLOFACIAL CT W/O 08/15/16 (per radiologist): No sinusitis.  CXR PA/LAT 06/18/16 (previously reviewed by me): No focal opacity or nodule appreciated. No pleural effusion. Heart normal in size & mediastinum normal in contour.  LABS 08/14/16 ESR: 17 CRP: 1.3 IgE: 49 RAST panel: Cataract 0.44 ANA: Negative Rheumatoid factor:  <10 Anti-CCP:  <16    Assessment & Plan:  65 y.o. female with history of chronic cough and Methacholine challenge test consistent with reactive airways disease/asthma. This correlates with her seasonal/environmental  rhinitis. Reviewed her serum testing which does show sensitivity to cat dander. I am holding on inhaler medications at this time in favor of starting Singulair. I suspect patient's current upper respiratory congestion and cough is due to a viral etiology. Her respiratory status seems to be stable at this time. I did advise patient that she could progress to a bacterial infection at which time I would recommend antibiotic therapy. I instructed her to contact my office if she has any new breathing problems or questions before next appointment.  1. URI:  Recommended OTC nasal decongestant. Patient to notify me for worsening symptoms or new infectious symptoms. 2. Asthma:  Starting patient on Singulair 77m qhs. Holding on inhaler medications at this time. 3. Seasonal Allergic  Rhinitis:  Starting patient on Singulair. Recommended continuing OTC Antihistamine medication. 4. Health Maintenance:  She reports she has never had a pneumonia vaccine. Plan for Pneumovax 23 at next appointment.  5. Follow-up: Patient to return to clinic in 2 months or sooner if needed.  Sonia Baller Ashok Cordia, M.D. [facility] Pulmonary & Critical Care Pager:  [phone] After 3pm or if no response, call [phone] 5:08 PM 09/12/16

## 2016-09-12 NOTE — Patient Instructions (Signed)
   You can continue using your Equate allergy medication.  I'm starting you on Singulair to help with your allergies.  Try using an over-the-counter decongestant medication.  Call me if you have any fever, chills, sweats, or feel like your symptoms are getting worse because then I will send you in a prescription.  I will see you back in 2 months or sooner if needed.  Call me if you have any questions.

## 2016-11-14 ENCOUNTER — Encounter: Payer: Self-pay | Admitting: Pulmonary Disease

## 2016-11-14 ENCOUNTER — Ambulatory Visit (INDEPENDENT_AMBULATORY_CARE_PROVIDER_SITE_OTHER): Payer: BLUE CROSS/BLUE SHIELD | Admitting: Pulmonary Disease

## 2016-11-14 VITALS — BP 132/76 | HR 87 | Ht 63.5 in | Wt 177.0 lb

## 2016-11-14 DIAGNOSIS — Z23 Encounter for immunization: Secondary | ICD-10-CM

## 2016-11-14 DIAGNOSIS — J4520 Mild intermittent asthma, uncomplicated: Secondary | ICD-10-CM | POA: Diagnosis not present

## 2016-11-14 DIAGNOSIS — J3081 Allergic rhinitis due to animal (cat) (dog) hair and dander: Secondary | ICD-10-CM | POA: Diagnosis not present

## 2016-11-14 MED ORDER — FLUTICASONE PROPIONATE 50 MCG/ACT NA SUSP: 1.0000 | Freq: Every day | NASAL | 3 refills | Status: DC

## 2016-11-14 NOTE — Patient Instructions (Signed)
   Call me if your cough & wheezing do not improve on Flonase.  Remember to angle the nasal spray away from the center of your nose/nasal septum to keep from getting a nose bleed.  Keep using your Singulair and other allergy medication.  I will see you back in 6 months or sooner if needed.

## 2016-11-14 NOTE — Addendum Note (Signed)
Addended by: Velvet BatheAULFIELD, ASHLEY L on: 11/14/2016 05:50 PM   Modules accepted: Orders

## 2016-11-14 NOTE — Progress Notes (Signed)
Subjective:    Patient ID: Allison Burnett, female    DOB: 20-Jul-1951, 65 y.o.   MRN: [id number]  C.C.:  Follow-up for Mild, Intermittent Asthma & Chronic Allergic Rhinitis.  HPI Mild, Intermittent Asthma:  Started on Singulair at last appointment. She reports her dyspnea and cough seem to be improving. She reports her husband has woke her a couple of times a night with wheezing. She hasn't herself awoken with any breathing problems.   Chronic Allergic Rhinitis:  Likely due to sensitivity to cat dander. Started on Singulair at last appointment. Also taking an OTC antihistamine. Reports minimal sinus congestion & post-nasal drainage.  Review of Systems No fever, chills, or sweats. No chest pain or pressure. No reflux, dyspepsia, or morning brash water taste.   Allergies  Allergen Reactions  . Mysoline [Primidone] Nausea And Vomiting  . Dilantin [Phenytoin Sodium Extended] Rash  . Phenobarbital Rash  . Sulfa Antibiotics Rash    Current Outpatient Prescriptions on File Prior to Visit  Medication Sig Dispense Refill  . Ascorbic Acid (VITAMIN C) 1000 MG tablet Take 1,000 mg by mouth daily.    . calcium carbonate (CALCIUM 600) 600 MG TABS tablet Take 1,200 mg by mouth daily with breakfast.    . Cyanocobalamin (VITAMIN B 12 PO) Take by mouth. V b 12 injection every 2 weeks    . gabapentin (NEURONTIN) 400 MG capsule Take 400 mg by mouth 3 (three) times daily.    Marland Kitchen guaiFENesin (MUCINEX) 600 MG 12 hr tablet Take 600 mg by mouth 2 (two) times daily.    . meloxicam (MOBIC) 15 MG tablet Take 15 mg by mouth daily.    . montelukast (SINGULAIR) 10 MG tablet Take 1 tablet (10 mg total) by mouth at bedtime. 30 tablet 3  . omeprazole (PRILOSEC) 40 MG capsule Take 40 mg by mouth 3 times/day as needed-between meals & bedtime.     . Probiotic Product (PHILLIPS COLON HEALTH PO) Take 1 capsule by mouth daily.    . promethazine-codeine (PHENERGAN WITH CODEINE) 6.25-10 MG/5ML syrup     . rivaroxaban (XARELTO)  20 MG TABS tablet Take 20 mg by mouth daily with supper.    Marland Kitchen tiZANidine (ZANAFLEX) 4 MG tablet Take 4 mg by mouth every 6 (six) hours as needed for muscle spasms.    Marland Kitchen venlafaxine (EFFEXOR) 75 MG tablet Take 75 mg by mouth 2 (two) times daily.    Marland Kitchen VITAMIN D, CHOLECALCIFEROL, PO Take 1 capsule by mouth daily.     No current facility-administered medications on file prior to visit.     Past Medical History:  Diagnosis Date  . Arthritis   . Atrial fibrillation ([place])   . DJD (degenerative joint disease)    bulging disc lower back  . Headache(784.0)    migraines  . PONV (postoperative nausea and vomiting) Jan 04, 2014   woke up during attempted colonscopy at [facility]  . Seizures ([place])    none since age 65, childhood only    Past Surgical History:  Procedure Laterality Date  . ABDOMINAL HYSTERECTOMY  06-2013   and bladder tach done  . COLONOSCOPY WITH PROPOFOL N/A 06/01/2014   Procedure: COLONOSCOPY WITH PROPOFOL;  Surgeon: Garlan Fair, MD;  Location: [facility];  Service: Endoscopy;  Laterality: N/A;  . colonscopy     multiple times due to family hx of colon cancer  . polyps removed from colon  in past  . [place]    .  spurs removed from spine  5 yrs ago    Family History  Problem Relation Age of Onset  . Colon cancer Maternal Grandmother   . Emphysema Maternal Grandfather   . Colon cancer Maternal Uncle   . Emphysema Mother   . Stroke Father   . Lung cancer Other     Social History   Social History  . Marital status: Married    Spouse name: N/A  . Number of children: N/A  . Years of education: N/A   Social History Main Topics  . Smoking status: Passive Smoke Exposure - Never Smoker  . Smokeless tobacco: Never Used     Comment: Parents until 35 y.o. as well as other family members  & husband  . Alcohol use Yes     Comment: occasional  . Drug use: No  . Sexual activity: Not Currently   Other Topics Concern  . None   Social History Narrative     Originally from [place], [place]. 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 Has a cat currently. Remote bird exposure as a child. No mold or asbestos exposure. Enjoys watching baseball games & off-roading. No hot tub exposure.       Objective:   Physical Exam BP 132/76 (BP Location: Left Arm, Cuff Size: Normal)   Pulse 87   Ht 5' 3.5" (1.613 m)   Wt 177 lb (80.3 kg)   SpO2 97%   BMI 30.86 kg/m  General:  Awake. Appears comfortable. Mild central obesity. Integument:  Warm & dry. No rash on exposed skin. Lymphatics:  No appreciated cervical or supraclavicular lymphadenoapthy. HEENT:  Moderate bilateral nasal turbinate swelling slightly worse than last time. No oral ulcers. Which mucous membranes  Cardiovascular:  Regular rate and rhythm.  No edema. Normal S1 & S2. Pulmonary: clear with auscultation bilaterally and normal work of breathing on room air. Speaking in complete sentences.  Abdomen: Soft. Normal bowel sounds. Protuberant.   PFT 06/25/16: FVC 2.39 L (78%) FEV1 1.91 L (82%) FEV1/FVC 0.80 FEF 25-75 1.83 L (89%) no bronchodilator response TLC 4.49 L (91%) RV 101% ERV 25% DLC uncorrected 79%  METHACHOLINE CHALLENGE TESTING (08/21/16): FEV1/FVC 0.80 pre-methacholine challenge. 33% reduction in FEV1 at 55 dilution. Significant bronchodilator response subsequent.  IMAGING BARIUM SWALLOW 08/21/16 (per radiologist): No evidence of mass, stricture, mucosal ulceration, or esophageal dysmotility.  MAXILLOFACIAL CT W/O 08/15/16 (per radiologist): No sinusitis.  CXR PA/LAT 06/18/16 (previously reviewed by me): No focal opacity or nodule appreciated. No pleural effusion. Heart normal in size & mediastinum normal in contour.  LABS 08/14/16 ESR: 17 CRP: 1.3 IgE: 49 RAST Panel: Cat Dander 0.44 ANA: Negative Rheumatoid factor:  <10 Anti-CCP:  <16    Assessment & Plan:  65 y.o. female with mild, intermittent asthma & chronic  allergic rhinitis. Allergic rhinitis likely secondary to her cat dander sensitivity. She does continue to have cats at home but does not sleep with them. Symptoms have mildly improved with the addition of Singulair to her regimen but with the nasal turbinate swelling I think that the addition of intranasal corticosteroids could significantly benefit her symptoms. I instructed the patient contact my office if she had any new breathing problems or questions before next appointment.  1. Mild, Intermittent Asthma:  Continuing patient on Singulair. Holding off on inhaler therapy at this time. Consider inhaled corticosteroid if wheezing and cough persist. 2. Chronic Allergic Rhinitis:  Continuing over-the-counter antihistamine and Singulair. Starting  Flonase 1 spray each nostril twice daily. 3. Health Maintenance:  S/P Influenza Vaccine October 2017. Administering Pneumovax 23 today.   4. Follow-up: Patient to return to clinic in 6 months or sooner if needed.  Sonia Baller Ashok Cordia, M.D. [facility] Pulmonary & Critical Care Pager:  [phone] After 3pm or if no response, call [phone] 5:17 PM 11/14/16

## 2017-01-20 ENCOUNTER — Other Ambulatory Visit: Payer: Self-pay | Admitting: Pulmonary Disease

## 2017-02-13 ENCOUNTER — Telehealth: Payer: Self-pay | Admitting: Pulmonary Disease

## 2017-02-13 MED ORDER — MONTELUKAST SODIUM 10 MG PO TABS: 10.0000 mg | ORAL_TABLET | Freq: Every day | ORAL | 1 refills | Status: DC

## 2017-02-13 NOTE — Telephone Encounter (Signed)
Spoke with pt. She is needing a 90 day supply of Singulair sent to [facility]. Rx has been sent in. Nothing further was needed.

## 2017-03-18 ENCOUNTER — Other Ambulatory Visit: Payer: Self-pay | Admitting: Neurosurgery

## 2017-03-18 DIAGNOSIS — M5416 Radiculopathy, lumbar region: Secondary | ICD-10-CM

## 2017-04-15 ENCOUNTER — Ambulatory Visit
Admission: RE | Admit: 2017-04-15 | Discharge: 2017-04-15 | Disposition: A | Discharge status: Home / Self Care | Payer: BLUE CROSS/BLUE SHIELD | Source: Ambulatory Visit | Attending: Neurosurgery | Admitting: Neurosurgery

## 2017-04-15 DIAGNOSIS — M5416 Radiculopathy, lumbar region: Secondary | ICD-10-CM

## 2017-04-15 MED ORDER — DIAZEPAM 5 MG PO TABS
5.0000 mg | ORAL_TABLET | Freq: Once | ORAL | Status: AC
  Administered 2017-04-15: 5 mg via ORAL

## 2017-04-15 MED ORDER — IOPAMIDOL (ISOVUE-M 200) INJECTION 41%
15.0000 mL | Freq: Once | INTRAMUSCULAR | Status: AC
  Administered 2017-04-15: 15 mL via INTRATHECAL

## 2017-04-15 NOTE — Progress Notes (Signed)
Patient states she has been off Effexor and Phenergan for at least the past two days.  She states she has been off Xarelto for the past day.  Donell Sievert, RN

## 2017-04-15 NOTE — Discharge Instructions (Signed)
Myelogram Discharge Instructions  1. Go home and rest quietly for the next 24 hours.  It is important to lie flat for the next 24 hours.  Get up only to go to the restroom.  You may lie in the bed or on a couch on your back, your stomach, your left side or your right side.  You may have one pillow under your head.  You may have pillows between your knees while you are on your side or under your knees while you are on your back.  2. DO NOT drive today.  Recline the seat as far back as it will go, while still wearing your seat belt, on the way home.  3. You may get up to go to the bathroom as needed.  You may sit up for 10 minutes to eat.  You may resume your normal diet and medications unless otherwise indicated.  Drink lots of extra fluids today and tomorrow.  4. The incidence of headache, nausea, or vomiting is about 5% (one in 20 patients).  If you develop a headache, lie flat and drink plenty of fluids until the headache goes away.  Caffeinated beverages may be helpful.  If you develop severe nausea and vomiting or a headache that does not go away with flat bed rest, call [phone].  5. You may resume normal activities after your 24 hours of bed rest is over; however, do not exert yourself strongly or do any heavy lifting tomorrow. If when you get up you have a headache when standing, go back to bed and force fluids for another 24 hours.  6. Call your physician for a follow-up appointment.  The results of your myelogram will be sent directly to your physician by the following day.  7. If you have any questions or if complications develop after you arrive home, please call [phone].  Discharge instructions have been explained to the patient.  The patient, or the person responsible for the patient, fully understands these instructions.       MAY RESUME XARELTO TODAY.  May resume Phenergan and Effexor on April 16, 2017, after 9:30 am.

## 2017-05-14 ENCOUNTER — Encounter: Payer: Self-pay | Admitting: Pulmonary Disease

## 2017-05-14 ENCOUNTER — Ambulatory Visit (INDEPENDENT_AMBULATORY_CARE_PROVIDER_SITE_OTHER): Payer: BLUE CROSS/BLUE SHIELD | Admitting: Pulmonary Disease

## 2017-05-14 VITALS — BP 124/80 | HR 86 | Ht 63.5 in | Wt 180.8 lb

## 2017-05-14 DIAGNOSIS — J309 Allergic rhinitis, unspecified: Secondary | ICD-10-CM

## 2017-05-14 DIAGNOSIS — J4530 Mild persistent asthma, uncomplicated: Secondary | ICD-10-CM

## 2017-05-14 MED ORDER — BECLOMETHASONE DIPROPIONATE 40 MCG/ACT IN AERS: 1.0000 | INHALATION_SPRAY | Freq: Two times a day (BID) | RESPIRATORY_TRACT | 0 refills | Status: DC

## 2017-05-14 NOTE — Patient Instructions (Signed)
   Continue using your medications as prescribed.  I want you to use the Qvar inhaler we are giving you today by inhaling 1 puff twice daily. If the cough doesn't totally go away try doing 2 puffs twice daily.  Let me know how you are doing with the inhaler after trying it for a couple of weeks so we can either send you in a prescription for it or try something different.  Remember to brush your teeth, brush your tongue, rinse, gargle, and spit after the Qvar to keep from getting thrush. Also don't use any alcohol containing mouth washes.  I will see you back in 6 months or sooner if needed.

## 2017-05-14 NOTE — Progress Notes (Signed)
Subjective:    Patient ID: Allison Burnett, female    DOB: Jun 20, 1951, 66 y.o.   MRN: [id number]  C.C.:  Follow-up for Mild, Intermittent Asthma & Chronic Allergic Rhinitis.  HPI Mild, intermittent asthma: Previously started on Singulair. She reports coughing with deep breath as well as laughing. She reports cough is nonproductive. Rare wheezing. She reports she rarely wakes up with any coughing or wheezing - once every other week. No exacerbations since last appointment.   Chronic allergic rhinitis: Likely due to cat dander. Started Flonase at last appointment. Previously taking Singulair and over-the-counter antihistamine. She denies any sinus congestion, pressure or drainage. She is continuing to use her OTC allergy medication. She isn't sure if the Flonase has helped significantly.   Review of Systems No chest tightness, pressure or pain. No fever or chills. No abdominal pain or nausea.   Allergies  Allergen Reactions  . Mysoline [Primidone] Nausea And Vomiting  . Dilantin [Phenytoin Sodium Extended] Rash  . Phenobarbital Rash  . Sulfa Antibiotics Rash    Current Outpatient Prescriptions on File Prior to Visit  Medication Sig Dispense Refill  . Ascorbic Acid (VITAMIN C) 1000 MG tablet Take 1,000 mg by mouth daily.    . calcium carbonate (CALCIUM 600) 600 MG TABS tablet Take 1,200 mg by mouth daily with breakfast.    . Cyanocobalamin (VITAMIN B 12 PO) Take by mouth. V b 12 injection every 2 weeks    . fluticasone (FLONASE) 50 MCG/ACT nasal spray Place 1 spray into both nostrils daily. 16 g 3  . gabapentin (NEURONTIN) 400 MG capsule Take 400 mg by mouth 3 (three) times daily.    Marland Kitchen guaiFENesin (MUCINEX) 600 MG 12 hr tablet Take 600 mg by mouth 2 (two) times daily.    . montelukast (SINGULAIR) 10 MG tablet Take 1 tablet (10 mg total) by mouth at bedtime. 90 tablet 1  . omeprazole (PRILOSEC) 40 MG capsule Take 40 mg by mouth 3 times/day as needed-between meals & bedtime.     . Probiotic  Product (PHILLIPS COLON HEALTH PO) Take 1 capsule by mouth daily.    . promethazine-codeine (PHENERGAN WITH CODEINE) 6.25-10 MG/5ML syrup     . rivaroxaban (XARELTO) 20 MG TABS tablet Take 20 mg by mouth daily with supper.    . venlafaxine (EFFEXOR) 75 MG tablet Take 75 mg by mouth 2 (two) times daily.    Marland Kitchen VITAMIN D, CHOLECALCIFEROL, PO Take 1 capsule by mouth daily.    . meloxicam (MOBIC) 15 MG tablet Take 15 mg by mouth daily.    Marland Kitchen tiZANidine (ZANAFLEX) 4 MG tablet Take 4 mg by mouth every 6 (six) hours as needed for muscle spasms.     No current facility-administered medications on file prior to visit.     Past Medical History:  Diagnosis Date  . Arthritis   . Atrial fibrillation ([place])   . DJD (degenerative joint disease)    bulging disc lower back  . Headache(784.0)    migraines  . PONV (postoperative nausea and vomiting) Jan 04, 2014   woke up during attempted colonscopy at [facility]  . Seizures ([place])    none since age 25, childhood only    Past Surgical History:  Procedure Laterality Date  . ABDOMINAL HYSTERECTOMY  06-2013   and bladder tach done  . COLONOSCOPY WITH PROPOFOL N/A 06/01/2014   Procedure: COLONOSCOPY WITH PROPOFOL;  Surgeon: Garlan Fair, MD;  Location: [facility];  Service: Endoscopy;  Laterality: N/A;  .  colonscopy     multiple times due to family hx of colon cancer  . polyps removed from colon  in past  . [place]    . spurs removed from spine  5 yrs ago    Family History  Problem Relation Age of Onset  . Colon cancer Maternal Grandmother   . Emphysema Maternal Grandfather   . Colon cancer Maternal Uncle   . Emphysema Mother   . Stroke Father   . Lung cancer Other     Social History   Social History  . Marital status: Married    Spouse name: N/A  . Number of children: N/A  . Years of education: N/A   Social History Main Topics  . Smoking status: Passive Smoke Exposure - Never Smoker  . Smokeless tobacco: Never Used      Comment: Parents until 97 y.o. as well as other family members  & husband  . Alcohol use Yes     Comment: occasional  . Drug use: No  . Sexual activity: Not Currently   Other Topics Concern  . None   Social History Narrative   Originally from [place], [place]. 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 Has a cat currently. Remote bird exposure as a child. No mold or asbestos exposure. Enjoys watching baseball games & off-roading. No hot tub exposure.       Objective:   Physical Exam BP 124/80 (BP Location: Right Arm, Patient Position: Sitting, Cuff Size: Normal)   Pulse 86   Ht 5' 3.5" (1.613 m)   Wt 180 lb 12.8 oz (82 kg)   SpO2 96%   BMI 31.52 kg/m  General:  Awake. Mild central obesity. No distress.  Integument:  Warm. Dry. No bruising. Extremities:  No cyanosis or clubbing.  HEENT:  Moist mucus membranes. Mild bilateral nasal turbinate swelling. No oral ulcers. Cardiovascular:  Regular rate. Regular rhythm. No edema. Pulmonary:  Clear to auscultation. Normal work of breathing on room air. Abdomen: Soft. Normal bowel sounds. Mildly protuberant. Neurological: Cranial nerves grossly intact. No meningismus.  PFT 06/25/16: FVC 2.39 L (78%) FEV1 1.91 L (82%) FEV1/FVC 0.80 FEF 25-75 1.83 L (89%) no bronchodilator response TLC 4.49 L (91%) RV 101% ERV 25% DLC uncorrected 79%  METHACHOLINE CHALLENGE TESTING (08/21/16): FEV1/FVC 0.80 pre-methacholine challenge. 33% reduction in FEV1 at 55 dilution. Significant bronchodilator response subsequent.  IMAGING BARIUM SWALLOW 08/21/16 (per radiologist): No evidence of mass, stricture, mucosal ulceration, or esophageal dysmotility.  MAXILLOFACIAL CT W/O 08/15/16 (per radiologist): No sinusitis.  CXR PA/LAT 06/18/16 (previously reviewed by me): No focal opacity or nodule appreciated. No pleural effusion. Heart normal in size & mediastinum normal in contour.  LABS 08/14/16 ESR:  17 CRP: 1.3 IgE: 49 RAST Panel: Cat Dander 0.44 ANA: Negative Rheumatoid factor:  <10 Anti-CCP:  <16    Assessment & Plan:  66 y.o. female with mild, Persistent asthma and chronic allergic rhinitis. Allergies and asthma likely reactive to cat dander. With patient's ongoing cough and symptoms I do feel that stepping up her treatment to include an inhaled corticosteroid may provide some symptomatic relief for the patient. Certainly, her chronic allergic rhinitis seems to be well-controlled. I instructed the patient to notify my office if she had any new breathing problems or questions before her next appointment and reviewed proper oral hygiene with her new inhaled corticosteroid.  1. Mild,Persistent asthma: Continuing Singulair. Starting Qvar 40 micrograms. Instructed patient to  inhale 1-2 puffs twice daily. She will contact my office for a prescription if this is effective and for an alternative agent if ineffective. 2. Chronic allergic rhinitis: Continuing Singulair, Flonase, and over-the-counter antihistamine. No changes. 3. Health maintenance: Status post Pneumovax November 2017 & influenza vaccine October 2017. Plan for Prevnar vaccine at next appointment. 4. Follow-up: Return to clinic in 6 months or sooner if needed.  Sonia Baller Ashok Cordia, M.D. [facility] Pulmonary & Critical Care Pager:  [phone] After 3pm or if no response, call [phone] 4:53 PM 05/14/17

## 2017-05-14 NOTE — Progress Notes (Signed)
Patient seen in the office today and instructed on use of Qvar 40.  Patient expressed understanding and demonstrated technique. 

## 2017-07-30 ENCOUNTER — Other Ambulatory Visit: Payer: Self-pay | Admitting: Pulmonary Disease

## 2017-09-23 DIAGNOSIS — E538 Deficiency of other specified B group vitamins: Secondary | ICD-10-CM | POA: Diagnosis not present

## 2017-10-22 DIAGNOSIS — J309 Allergic rhinitis, unspecified: Secondary | ICD-10-CM | POA: Diagnosis not present

## 2017-10-22 DIAGNOSIS — G2581 Restless legs syndrome: Secondary | ICD-10-CM | POA: Diagnosis not present

## 2017-10-22 DIAGNOSIS — I4891 Unspecified atrial fibrillation: Secondary | ICD-10-CM | POA: Diagnosis not present

## 2017-10-22 DIAGNOSIS — E538 Deficiency of other specified B group vitamins: Secondary | ICD-10-CM | POA: Diagnosis not present

## 2017-10-22 DIAGNOSIS — M810 Age-related osteoporosis without current pathological fracture: Secondary | ICD-10-CM | POA: Diagnosis not present

## 2017-10-22 DIAGNOSIS — Z23 Encounter for immunization: Secondary | ICD-10-CM | POA: Diagnosis not present

## 2017-10-22 DIAGNOSIS — Z1159 Encounter for screening for other viral diseases: Secondary | ICD-10-CM | POA: Diagnosis not present

## 2017-10-22 DIAGNOSIS — Z0000 Encounter for general adult medical examination without abnormal findings: Secondary | ICD-10-CM | POA: Diagnosis not present

## 2017-10-22 DIAGNOSIS — M545 Low back pain: Secondary | ICD-10-CM | POA: Diagnosis not present

## 2017-10-22 DIAGNOSIS — F329 Major depressive disorder, single episode, unspecified: Secondary | ICD-10-CM | POA: Diagnosis not present

## 2017-10-22 DIAGNOSIS — G43109 Migraine with aura, not intractable, without status migrainosus: Secondary | ICD-10-CM | POA: Diagnosis not present

## 2017-11-11 DIAGNOSIS — I6529 Occlusion and stenosis of unspecified carotid artery: Secondary | ICD-10-CM | POA: Diagnosis not present

## 2017-11-11 DIAGNOSIS — I739 Peripheral vascular disease, unspecified: Secondary | ICD-10-CM | POA: Diagnosis not present

## 2017-11-14 ENCOUNTER — Ambulatory Visit (INDEPENDENT_AMBULATORY_CARE_PROVIDER_SITE_OTHER): Payer: Medicare Other | Admitting: Pulmonary Disease

## 2017-11-14 ENCOUNTER — Encounter: Payer: Self-pay | Admitting: Pulmonary Disease

## 2017-11-14 VITALS — BP 134/74 | HR 81 | Ht 63.0 in | Wt 184.5 lb

## 2017-11-14 DIAGNOSIS — J309 Allergic rhinitis, unspecified: Secondary | ICD-10-CM | POA: Diagnosis not present

## 2017-11-14 DIAGNOSIS — J4530 Mild persistent asthma, uncomplicated: Secondary | ICD-10-CM

## 2017-11-14 MED ORDER — ALBUTEROL SULFATE HFA 108 (90 BASE) MCG/ACT IN AERS: 2.0000 | INHALATION_SPRAY | RESPIRATORY_TRACT | 3 refills | Status: AC | PRN

## 2017-11-14 MED ORDER — ALBUTEROL SULFATE HFA 108 (90 BASE) MCG/ACT IN AERS: 2.0000 | INHALATION_SPRAY | RESPIRATORY_TRACT | 3 refills | Status: DC | PRN

## 2017-11-14 MED ORDER — BUDESONIDE-FORMOTEROL FUMARATE 80-4.5 MCG/ACT IN AERO: 2.0000 | INHALATION_SPRAY | Freq: Two times a day (BID) | RESPIRATORY_TRACT | 0 refills | Status: DC

## 2017-11-14 MED ORDER — SPACER/AERO CHAMBER MOUTHPIECE MISC: 1.0000 | 0 refills | Status: DC

## 2017-11-14 NOTE — Addendum Note (Signed)
Addended by: Jacquiline DoeROGDON, Jillyn Stacey M on: 11/14/2017 03:22 PM   Modules accepted: Orders

## 2017-11-14 NOTE — Progress Notes (Signed)
Subjective:    Patient ID: Allison Burnett, female    DOB: October 12, 1951, 66 y.o.   MRN: [id number]  C.C.:  Follow-up for Moderate, Persistent Asthma & Chronic Allergic Rhinitis.  HPI Moderate, persistent asthma: Previously given sample of Qvar 40 g & instructed to use 1-2 inhalations twice daily. Previously prescribed Singulair. She reports a worsening in her baseline cough that seems to be more since the Fall started. She reports increased coughing with deep breathing & laughing. No other inhaled irritants that seem to trigger her cough. She reports minimal wheezing, primarily at night. She reports Robitussin DM is the only thing that seems to help her cough. She reports the Qvar didn't seem to help her cough. She denies any dyspnea. Cough is nonproductive.   Chronic allergic rhinitis: Likely due to cat dander. Continued on Flonase, Singulair, and over-the-counter antihistamine therapy at last appointment. She denies any increased sinus congestion, pressure or drainage. She is out of Flonase & Singulair. She is still taking Equate Allergy Relief twice daily.   Review of Systems No fever, chills, or sweats. No chest pain, tightness, or pressure. Does have some discomfort with her coughing spells. No abdominal pain, nausea, or emesis.   Allergies  Allergen Reactions  . Mysoline [Primidone] Nausea And Vomiting  . Dilantin [Phenytoin Sodium Extended] Rash  . Phenobarbital Rash  . Sulfa Antibiotics Rash    Current Outpatient Medications on File Prior to Visit  Medication Sig Dispense Refill  . Ascorbic Acid (VITAMIN C) 1000 MG tablet Take 1,000 mg by mouth daily.    . calcium carbonate (CALCIUM 600) 600 MG TABS tablet Take 1,200 mg by mouth daily with breakfast.    . Cyanocobalamin (VITAMIN B 12 PO) Take every 30 (thirty) days by mouth. V b 12 injection every 2 weeks    . gabapentin (NEURONTIN) 400 MG capsule Take 400 mg by mouth 3 (three) times daily.    Marland Kitchen guaiFENesin (MUCINEX) 600 MG 12 hr  tablet Take 600 mg by mouth 2 (two) times daily.    . montelukast (SINGULAIR) 10 MG tablet TAKE 1 TABLET AT BEDTIME 90 tablet 1  . omeprazole (PRILOSEC) 40 MG capsule Take 40 mg by mouth 3 times/day as needed-between meals & bedtime.     . Probiotic Product (PHILLIPS COLON HEALTH PO) Take 1 capsule by mouth daily.    . rivaroxaban (XARELTO) 20 MG TABS tablet Take 20 mg by mouth daily with supper.    . venlafaxine (EFFEXOR) 75 MG tablet Take 75 mg by mouth 2 (two) times daily.    Marland Kitchen VITAMIN D, CHOLECALCIFEROL, PO Take 1 capsule by mouth daily.    . beclomethasone (QVAR) 40 MCG/ACT inhaler Inhale 1 puff into the lungs 2 (two) times daily. (Patient not taking: Reported on 11/14/2017) 1 Inhaler 0  . fluticasone (FLONASE) 50 MCG/ACT nasal spray Place 1 spray into both nostrils daily. (Patient not taking: Reported on 11/14/2017) 16 g 3  . meloxicam (MOBIC) 15 MG tablet Take 15 mg by mouth daily.    . promethazine-codeine (PHENERGAN WITH CODEINE) 6.25-10 MG/5ML syrup     . tiZANidine (ZANAFLEX) 4 MG tablet Take 4 mg by mouth every 6 (six) hours as needed for muscle spasms.     No current facility-administered medications on file prior to visit.     Past Medical History:  Diagnosis Date  . Arthritis   . Atrial fibrillation ([place])   . DJD (degenerative joint disease)    bulging disc lower back  .  Headache(784.0)    migraines  . PONV (postoperative nausea and vomiting) Jan 04, 2014   woke up during attempted colonscopy at [facility]  . Seizures ([place])    none since age 66, childhood only    Past Surgical History:  Procedure Laterality Date  . ABDOMINAL HYSTERECTOMY  06-2013   and bladder tach done  . COLONOSCOPY WITH PROPOFOL N/A 06/01/2014   Procedure: COLONOSCOPY WITH PROPOFOL;  Surgeon: Garlan Fair, MD;  Location: [facility];  Service: Endoscopy;  Laterality: N/A;  . colonscopy     multiple times due to family hx of colon cancer  . polyps removed from colon  in past  . [place]    . spurs removed from spine  5 yrs ago    Family History  Problem Relation Age of Onset  . Colon cancer Maternal Grandmother   . Emphysema Maternal Grandfather   . Colon cancer Maternal Uncle   . Emphysema Mother   . Stroke Father   . Lung cancer Other     Social History   Socioeconomic History  . Marital status: Married    Spouse name: None  . Number of children: None  . Years of education: None  . Highest education level: None  Social Needs  . Financial resource strain: None  . Food insecurity - worry: None  . Food insecurity - inability: None  . Transportation needs - medical: None  . Transportation needs - non-medical: None  Occupational History  . None  Tobacco Use  . Smoking status: Passive Smoke Exposure - Never Smoker  . Smokeless tobacco: Never Used  . Tobacco comment: Parents until 25 y.o. as well as other family members  & husband  Substance and Sexual Activity  . Alcohol use: Yes    Comment: occasional  . Drug use: No  . Sexual activity: Not Currently  Other Topics Concern  . None  Social History Narrative   Originally from [place], [place]. 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 Has a cat currently. Remote bird exposure as a child. No mold or asbestos exposure. Enjoys watching baseball games & off-roading. No hot tub exposure.       Objective:   Physical Exam BP 134/74 (BP Location: Right Arm, Cuff Size: Normal)   Pulse 81   Ht 5' 3" (1.6 m)   Wt 184 lb 8 oz (83.7 kg)   SpO2 98%   BMI 32.68 kg/m   General:  Awake. Mild central obesity. No distress.  Integument:  No rash. Warm. Dry. Extremities:  No cyanosis or clubbing.  HEENT:  Mild bilateral nasal turbinate swelling. Mildly erythematous nasal mucosa. No oral ulcers. Cardiovascular:  Regular rate. No edema. Regular rhythm.  Pulmonary:  Clear bilaterally to auscultation. Normal work of breathing on room air. Abdomen: Soft.  Normal bowel sounds. Protuberant. Musculoskeletal:  Normal bulk and tone. No joint deformity or effusion appreciated. Neurological:  Cranial nerves 2-12 grossly in tact. No meningismus. Moving all 4 extremities equally.   PFT 06/25/16: FVC 2.39 L (78%) FEV1 1.91 L (82%) FEV1/FVC 0.80 FEF 25-75 1.83 L (89%) no bronchodilator response TLC 4.49 L (91%) RV 101% ERV 25% DLC uncorrected 79%  METHACHOLINE CHALLENGE TESTING (08/21/16): FEV1/FVC 0.80 pre-methacholine challenge. 33% reduction in FEV1 at level 5 dilution. Significant bronchodilator response subsequent.  IMAGING BARIUM SWALLOW 08/21/16 (per radiologist): No evidence of mass, stricture, mucosal ulceration, or esophageal dysmotility.  MAXILLOFACIAL CT  W/O 08/15/16 (per radiologist): No sinusitis.  CXR PA/LAT 06/18/16 (previously reviewed by me): No focal opacity or nodule appreciated. No pleural effusion. Heart normal in size & mediastinum normal in contour.  LABS 08/14/16 ESR: 17 CRP: 1.3 IgE: 49 RAST Panel: Cat Dander 0.44 ANA: Negative Rheumatoid factor:  <10 Anti-CCP:  <16    Assessment & Plan:  66 y.o. female with moderate persistent asthma based on the fact that she does have a continued daily cough and nocturnal awakenings. Patient denies any previous symptom control with brief exposure to Qvar. I doubt the patient has an underlying neurogenic cough given that she is on 1200 mg of Neurontin. With her chronic use of Zoloft I believe it unsafe to attempt treatment with Ultram for cough suppression. She has no symptoms that would suggest reflux and previous esophagram did not demonstrate any either. Given the seasonal variability to her cough this seems to be most consistent with her underlying abnormal bronchial hyperreactivity. Overall her chronic allergic rhinitis seems to be reasonably well controlled with her over-the-counter antihistamine therapy; as such, I am not restarting any other medications at this time. I instructed  the patient to contact my office if she had questions or concerns before her next appointment.   1. Moderate persistent asthma: Prescribing the patient albuterol inhaler to use as needed. Given samples of Symbicort 80/4.5 with spacer today & instructed on proper oral hygiene. 2. Chronic allergic rhinitis: Continuing over-the-counter antihistamine therapy. No new medications. 3. Health maintenance: Status post Pneumovax November 2017 & Flu Vaccine October 2018. Believes she had a "pneumonia" vaccine last month as well - likely Prevnar.  4. Follow-up: Return to clinic in 4 weeks or sooner if needed.  Sonia Baller Ashok Cordia, M.D. [facility] Pulmonary & Critical Care Pager:  [phone] After 3pm or if no response, call [phone] 2:52 PM 11/14/17

## 2017-11-14 NOTE — Patient Instructions (Signed)
   Call me if you have any questions or concerns before your next appointment.  Use the Symbicort with the spacer we are giving you today. Inhale 2 puffs twice daily.  Remember to remove any dentures or partials you have before you use your Symbicort inhaler. Remember to brush your teeth & tongue after you use your inhaler as well as rinse, gargle & spit to keep from getting thrush in your mouth or on your tongue (a white film).   We are also sending in an Albuterol inhaler to use as needed for your cough or wheezing.

## 2017-11-19 DIAGNOSIS — I6529 Occlusion and stenosis of unspecified carotid artery: Secondary | ICD-10-CM | POA: Diagnosis not present

## 2017-11-19 DIAGNOSIS — E538 Deficiency of other specified B group vitamins: Secondary | ICD-10-CM | POA: Diagnosis not present

## 2017-11-19 DIAGNOSIS — I739 Peripheral vascular disease, unspecified: Secondary | ICD-10-CM | POA: Diagnosis not present

## 2017-12-18 ENCOUNTER — Encounter: Payer: Self-pay | Admitting: Pulmonary Disease

## 2017-12-18 ENCOUNTER — Ambulatory Visit (INDEPENDENT_AMBULATORY_CARE_PROVIDER_SITE_OTHER): Payer: Medicare Other | Admitting: Pulmonary Disease

## 2017-12-18 VITALS — BP 126/78 | HR 65 | Ht 63.5 in | Wt 182.0 lb

## 2017-12-18 DIAGNOSIS — J309 Allergic rhinitis, unspecified: Secondary | ICD-10-CM

## 2017-12-18 DIAGNOSIS — K219 Gastro-esophageal reflux disease without esophagitis: Secondary | ICD-10-CM | POA: Diagnosis not present

## 2017-12-18 DIAGNOSIS — R059 Cough, unspecified: Secondary | ICD-10-CM

## 2017-12-18 DIAGNOSIS — R05 Cough: Secondary | ICD-10-CM | POA: Diagnosis not present

## 2017-12-18 DIAGNOSIS — E538 Deficiency of other specified B group vitamins: Secondary | ICD-10-CM | POA: Diagnosis not present

## 2017-12-18 MED ORDER — GABAPENTIN 600 MG PO TABS: 600.0000 mg | ORAL_TABLET | Freq: Three times a day (TID) | ORAL | 3 refills | Status: DC

## 2017-12-18 MED ORDER — GABAPENTIN 600 MG PO TABS: 600.0000 mg | ORAL_TABLET | Freq: Three times a day (TID) | ORAL | 1 refills | Status: DC

## 2017-12-18 MED ORDER — OMEPRAZOLE 40 MG PO CPDR
40.0000 mg | DELAYED_RELEASE_CAPSULE | Freq: Two times a day (BID) | ORAL | 3 refills | Status: DC | PRN
Start: 2017-12-18 — End: 2017-12-30

## 2017-12-18 MED ORDER — OMEPRAZOLE 40 MG PO CPDR: 40.0000 mg | DELAYED_RELEASE_CAPSULE | Freq: Two times a day (BID) | ORAL | 3 refills | Status: DC | PRN

## 2017-12-18 NOTE — Progress Notes (Signed)
Subjective:    Patient ID: Allison Burnett, female    DOB: [birth year], 66 y.o.   MRN: [id number]  Synopsis: Former patient of [name] with cough.  HPI Chief Complaint  Patient presents with  . Follow-up    former JN pt- pt states cough is baseline since last OV. pt reports of non prod cough.     Cough: > she tried symbicort recently per Dr. Ammie Dalton recommendation > she still has bad coughing spells (dry) that happen until her chest hurts > taking a deep breath or laughing a lot makes her cough > she feels like she is choking sometime > she doesn't wheeze > no correlation with meals > she sometimes feel like its a tickle in her  > no correlation with lying flat > can happen out of nowhere > she has allergies, she knows she has allergies to a c  Asthma: > "I've never had any breathing problems, just this cough" > never had bronchitis  Symbicort didn't make any difference.    She has omeprazole on her medication list but she doesn't think that she takes it.    She takes gabapentin for foot pain three times per day.   Denies:  Past Medical History:  Diagnosis Date  . Arthritis   . Atrial fibrillation ([place])   . DJD (degenerative joint disease)    bulging disc lower back  . Headache(784.0)    migraines  . PONV (postoperative nausea and vomiting) Jan 04, 2014   woke up during attempted colonscopy at [facility]  . Seizures ([place])    none since age 89, childhood only      Review of Systems  Constitutional: Negative for chills, fatigue and fever.  HENT: Negative for rhinorrhea, sinus pressure and sinus pain.   Respiratory: Positive for cough. Negative for choking and stridor.   Cardiovascular: Negative for chest pain, palpitations and leg swelling.       Objective:   Physical Exam  Vitals:   12/18/17 1331  BP: 126/78  Pulse: 65  SpO2: 98%  Weight: 182 lb (82.6 kg)  Height: 5' 3.5" (1.613 m)   Gen: well appearing HENT: OP clear, TM's clear, neck  supple PULM: CTA B, normal percussion CV: RRR, no mgr, trace edema GI: BS+, soft, nontender Derm: no cyanosis or rash Psyche: normal mood and affect   CBC    Component Value Date/Time   WBC 7.8 12/15/2007 1254   RBC 3.77 (L) 12/15/2007 1254   HGB 12.5 12/15/2007 1254   HCT 36.8 12/15/2007 1254   PLT 489 (H) 12/15/2007 1254   MCV 97.6 12/15/2007 1254   MCHC 34.0 12/15/2007 1254   RDW 12.6 12/15/2007 1254   PFT 06/25/16: FVC 2.39 L (78%) FEV1 1.91 L (82%) FEV1/FVC 0.80 FEF 25-75 1.83 L (89%) no bronchodilator response TLC 4.49 L (91%) RV 101% ERV 25% DLC uncorrected 79%  METHACHOLINE CHALLENGE TESTING (08/21/16):FEV1/FVC 0.80 pre-methacholine challenge. 33% reduction in FEV1 at level 5 dilution. Significant bronchodilator response subsequent.  IMAGING BARIUM SWALLOW 08/21/16 (per radiologist):No evidence of mass, stricture, mucosal ulceration, or esophageal dysmotility.  MAXILLOFACIAL CT W/O 08/15/16 (per radiologist):No sinusitis.  CXR PA/LAT 06/18/16 :No focal opacity or nodule appreciated. No pleural effusion. Heart normal in size &mediastinum normal in contour.  LABS 08/14/16 ESR: 17 CRP: 1.3 IgE: 49 RAST Panel: Cat Dander0.44 ANA: Negative Rheumatoid factor: <10 Anti-CCP: <16       Assessment & Plan:    No diagnosis found.  Discussion: Taisia's  primary problem is cough with background of allergic rhinitis.  I am not convinced she has asthma as she is never reported any sort of shortness of breath or wheezing.  She is never had bronchitis, but she is never had to be treated with prednisone.  Symbicort and Qvar have made no difference.  I think she probably has allergic rhinitis from her cat allergy and refusal to avoid cats.  She probably also has acid reflux.  She is not too confident in which medicine she is taking so it is unclear to me if she is taking the omeprazole.  She also likely has a degree of cyclical cough or what some people call irritable  larynx syndrome so I think voice rest would be helpful here.  In summary, I see very little evidence of an underlying lung disease to explain her cough.  I believe this is primarily due to laryngeal irritation.  Plan: Cough: I believe this is likely related to excessive sensitivity of your vocal cords (irritable larynx syndrome or cyclical cough) and possibly gastroesophageal reflux disease with allergic rhinitis: Use Neil Med rinses with distilled water at least twice per day using the instructions on the package. 1/2 hour after using the [facility] Med rinse, use Nasacort two puffs in each nostril once per day.  Remember that the Nasacort can take 1-2 weeks to work after regular use. Use generic zyrtec (cetirizine) every day.  If this doesn't help, then stop taking it and use chlorpheniramine-phenylephrine combination tablets. If your cough is not improved by the next visit then we will check a test called and exhaled nitric oxide test  Asthma: I do not think you have asthma, stop taking Symbicort  GERD: Take omeprazole 101m twice a day  Allergic rhinitis: Take the Zyrtec  We will see you back in 4 weeks or sooner if needed    Current Outpatient Medications:  .  albuterol (PROVENTIL HFA;VENTOLIN HFA) 108 (90 Base) MCG/ACT inhaler, Inhale 2 puffs every 4 (four) hours as needed into the lungs for wheezing or shortness of breath., Disp: 1 Inhaler, Rfl: 3 .  Ascorbic Acid (VITAMIN C) 1000 MG tablet, Take 1,000 mg by mouth daily., Disp: , Rfl:  .  budesonide-formoterol (SYMBICORT) 80-4.5 MCG/ACT inhaler, Inhale 2 puffs 2 (two) times daily into the lungs., Disp: 1 Inhaler, Rfl: 0 .  calcium carbonate (CALCIUM 600) 600 MG TABS tablet, Take 1,200 mg by mouth daily with breakfast., Disp: , Rfl:  .  Cyanocobalamin (VITAMIN B 12 PO), Take every 30 (thirty) days by mouth. V b 12 injection every 2 weeks, Disp: , Rfl:  .  gabapentin (NEURONTIN) 400 MG capsule, Take 400 mg by mouth 3 (three) times daily.,  Disp: , Rfl:  .  guaiFENesin (MUCINEX) 600 MG 12 hr tablet, Take 600 mg by mouth 2 (two) times daily., Disp: , Rfl:  .  meloxicam (MOBIC) 15 MG tablet, Take 15 mg by mouth daily., Disp: , Rfl:  .  omeprazole (PRILOSEC) 40 MG capsule, Take 40 mg by mouth 3 times/day as needed-between meals & bedtime. , Disp: , Rfl:  .  Probiotic Product (PHILLIPS COLON HEALTH PO), Take 1 capsule by mouth daily., Disp: , Rfl:  .  promethazine-codeine (PHENERGAN WITH CODEINE) 6.25-10 MG/5ML syrup, , Disp: , Rfl:  .  rivaroxaban (XARELTO) 20 MG TABS tablet, Take 20 mg by mouth daily with supper., Disp: , Rfl:  .  Spacer/Aero Chamber Mouthpiece MISC, 1 Device as directed by Does not apply route., Disp: 1  each, Rfl: 0 .  tiZANidine (ZANAFLEX) 4 MG tablet, Take 4 mg by mouth every 6 (six) hours as needed for muscle spasms., Disp: , Rfl:  .  venlafaxine (EFFEXOR) 75 MG tablet, Take 75 mg by mouth 2 (two) times daily., Disp: , Rfl:  .  VITAMIN D, CHOLECALCIFEROL, PO, Take 1 capsule by mouth daily., Disp: , Rfl:

## 2017-12-18 NOTE — Patient Instructions (Signed)
Cough: I believe this is likely related to excessive sensitivity of your vocal cords (irritable larynx syndrome or cyclical cough) and possibly gastroesophageal reflux disease with allergic rhinitis: Use Neil Med rinses with distilled water at least twice per day using the instructions on the package. 1/2 hour after using the [facility], use Nasacort two puffs in each nostril once per day.  Remember that the Nasacort can take 1-2 weeks to work after regular use. Use generic zyrtec (cetirizine) every day.  If this doesn't help, then stop taking it and use chlorpheniramine-phenylephrine combination tablets. If your cough is not improved by the next visit then we will check a test called and exhaled nitric oxide test  Asthma: I do not think you have asthma, stop taking Symbicort  GERD: Take omeprazole 40mg  twice a day  Allergic rhinitis: Take the Zyrtec  We will see you back in 4 weeks or sooner if needed

## 2017-12-20 ENCOUNTER — Telehealth: Payer: Self-pay | Admitting: Pulmonary Disease

## 2017-12-20 NOTE — Telephone Encounter (Signed)
Initiated PA for Allison LennertSusan Hannis on omeprazole in [employer]. Key [id number].

## 2017-12-26 NOTE — Telephone Encounter (Signed)
PA denied for omeprazole because it is ordered as TID. It will be accepted if ordered BID. BQ-please advise.

## 2017-12-29 NOTE — Telephone Encounter (Signed)
bid

## 2017-12-30 MED ORDER — OMEPRAZOLE 40 MG PO CPDR: 40.0000 mg | DELAYED_RELEASE_CAPSULE | Freq: Two times a day (BID) | ORAL | 3 refills | Status: AC

## 2017-12-30 NOTE — Telephone Encounter (Signed)
With the PA being denied having the Rx being TID, have sent in a new Rx stating for the Rx being BID. Will see if this will be accepted or seeing if we need to do another PA with the frequency changing. Nothing further needed at this time.

## 2018-01-09 ENCOUNTER — Other Ambulatory Visit: Payer: Self-pay | Admitting: Physician Assistant

## 2018-01-09 ENCOUNTER — Ambulatory Visit
Admission: RE | Admit: 2018-01-09 | Discharge: 2018-01-09 | Disposition: A | Discharge status: Home / Self Care | Payer: Medicare Other | Source: Ambulatory Visit | Attending: Physician Assistant | Admitting: Physician Assistant

## 2018-01-09 DIAGNOSIS — N950 Postmenopausal bleeding: Secondary | ICD-10-CM

## 2018-01-09 DIAGNOSIS — E538 Deficiency of other specified B group vitamins: Secondary | ICD-10-CM | POA: Diagnosis not present

## 2018-01-23 DIAGNOSIS — D649 Anemia, unspecified: Secondary | ICD-10-CM | POA: Diagnosis not present

## 2018-01-28 DIAGNOSIS — Z8742 Personal history of other diseases of the female genital tract: Secondary | ICD-10-CM | POA: Diagnosis not present

## 2018-01-28 DIAGNOSIS — Z7901 Long term (current) use of anticoagulants: Secondary | ICD-10-CM | POA: Diagnosis not present

## 2018-01-28 DIAGNOSIS — I4891 Unspecified atrial fibrillation: Secondary | ICD-10-CM | POA: Diagnosis not present

## 2018-01-28 DIAGNOSIS — Z90722 Acquired absence of ovaries, bilateral: Secondary | ICD-10-CM | POA: Diagnosis not present

## 2018-01-28 DIAGNOSIS — N950 Postmenopausal bleeding: Secondary | ICD-10-CM | POA: Diagnosis not present

## 2018-01-28 DIAGNOSIS — Z90710 Acquired absence of both cervix and uterus: Secondary | ICD-10-CM | POA: Diagnosis not present

## 2018-01-28 DIAGNOSIS — Z01812 Encounter for preprocedural laboratory examination: Secondary | ICD-10-CM | POA: Diagnosis not present

## 2018-01-28 DIAGNOSIS — R1909 Other intra-abdominal and pelvic swelling, mass and lump: Secondary | ICD-10-CM | POA: Diagnosis not present

## 2018-01-29 ENCOUNTER — Encounter: Payer: Self-pay | Admitting: Pulmonary Disease

## 2018-01-29 ENCOUNTER — Ambulatory Visit (INDEPENDENT_AMBULATORY_CARE_PROVIDER_SITE_OTHER): Payer: Medicare Other | Admitting: Pulmonary Disease

## 2018-01-29 VITALS — BP 126/78 | HR 78 | Ht 63.0 in | Wt 184.0 lb

## 2018-01-29 DIAGNOSIS — J309 Allergic rhinitis, unspecified: Secondary | ICD-10-CM

## 2018-01-29 DIAGNOSIS — R059 Cough, unspecified: Secondary | ICD-10-CM

## 2018-01-29 DIAGNOSIS — R05 Cough: Secondary | ICD-10-CM

## 2018-01-29 DIAGNOSIS — K219 Gastro-esophageal reflux disease without esophagitis: Secondary | ICD-10-CM

## 2018-01-29 LAB — POCT EXHALED NITRIC OXIDE: FENO LEVEL (PPB): 18

## 2018-01-29 MED ORDER — MONTELUKAST SODIUM 10 MG PO TABS: 10.0000 mg | ORAL_TABLET | Freq: Every day | ORAL | 11 refills | Status: DC

## 2018-01-29 NOTE — Progress Notes (Signed)
Subjective:    Patient ID: Allison Burnett, female    DOB: Dec 18, 1951, 67 y.o.   MRN: [id number]  Synopsis: Former patient of [name] with cough in setting of allergic rhinitis and gastroesophageal reflux disease   HPI Chief Complaint  Patient presents with  . Follow-up    pt feels cough worsen since last ov.     Marcina stopped symbicort after the last visit: no trouble breathing. Cough: > she had no benefit off or on Symbicort > she says that she will have a tickling in her throat and will just have a severe presistent coughing spell > no mucus production with her cough > she tried voice rest for three days, she says that she is not sure if the cough got better or not; the cough was better during   Post nasal drip: > she is taking the nasacort twice a day > she has some sinus congestion right now> robitussin DM  > robitussin DM helps the cough  GERD: > she takes omeprazole 72m twice a day > she has never been on an antacid     Past Medical History:  Diagnosis Date  . Arthritis   . Atrial fibrillation ([place]   . DJD (degenerative joint disease)    bulging disc lower back  . Headache(784.0)    migraines  . PONV (postoperative nausea and vomiting) Jan 04, 2014   [facility]  . [place]    none since age 67 childhood only      Review of Systems  Constitutional: Negative for chills, fatigue and fever.  HENT: Negative for rhinorrhea, sinus pressure and sinus pain.   Respiratory: Positive for cough. Negative for choking and stridor.   Cardiovascular: Negative for chest pain, palpitations and leg swelling.       Objective:   Physical Exam  Vitals:   01/29/18 1145  BP: 126/78  Pulse: 78  SpO2: 99%  Weight: 184 lb (83.5 kg)  Height: 5' 3"  (1.6 m)    Gen: well appearing HENT: OP clear, TM's clear, neck supple PULM: CTA B, normal percussion CV: RRR, no mgr, trace edema GI: BS+, soft, nontender Derm: no cyanosis  or rash Psyche: normal mood and affect    CBC    Component Value Date/Time   WBC 7.8 12/15/2007 1254   RBC 3.77 (L) 12/15/2007 1254   HGB 12.5 12/15/2007 1254   HCT 36.8 12/15/2007 1254   PLT 489 (H) 12/15/2007 1254   MCV 97.6 12/15/2007 1254   MCHC 34.0 12/15/2007 1254   RDW 12.6 12/15/2007 1254   PFT 06/25/16: FVC 2.39 L (78%) FEV1 1.91 L (82%) FEV1/FVC 0.80 FEF 25-75 1.83 L (89%) no bronchodilator response TLC 4.49 L (91%) RV 101% ERV 25% DLC uncorrected 79%  METHACHOLINE CHALLENGE TESTING (08/21/16):FEV1/FVC 0.80 pre-methacholine challenge. 33% reduction in FEV1 at level 5 dilution. Significant bronchodilator response subsequent.  Exhaled nitric oxide test: January 2019 18 ppm, off Symbicort  IMAGING BARIUM SWALLOW 08/21/16 (per radiologist):No evidence of mass, stricture, mucosal ulceration, or esophageal dysmotility.  MAXILLOFACIAL CT W/O 08/15/16 (per radiologist):No sinusitis.  CXR PA/LAT 06/18/16 :No focal opacity or nodule appreciated. No pleural effusion. Heart normal in size &mediastinum normal in contour.  LABS 08/14/16 ESR: 17 CRP: 1.3 IgE: 49 RAST Panel: Cat Dander0.44 ANA: Negative Rheumatoid factor: <10 Anti-CCP: <16       Assessment & Plan:    Chronic allergic rhinitis  Cough  Gastroesophageal reflux disease, esophagitis  presence not specified  Discussion: She has upper airway cough syndrome from acid reflux, allergic rhinitis, and ongoing laryngeal irritation.  She has this despite taking a high dose of Neurontin.  I wonder whether or not she should be switched to Elavil.  Because of ongoing allergic rhinitis symptoms I am going to add montelukast to see if this helps.  Exhaled NO testing today was normal.   Plan: Upper airway cough syndrome: Continue to take acid reflux treatment (omeprazole twice a day) Continue to take allergic rhinitis treatment: Cetirizine daily, Flonase nasal spray daily Add montelukast 10 mg daily  If  your cough is worse on the next visit then we will talk to your primary care doctor and neurologist about switching you from venlafaxine to Elavil  We will see you back in 4-6 weeks with a nurse practitioner    Current Outpatient Medications:  .  Ascorbic Acid (VITAMIN C) 1000 MG tablet, Take 1,000 mg by mouth daily., Disp: , Rfl:  .  calcium carbonate (CALCIUM 600) 600 MG TABS tablet, Take 1,200 mg by mouth daily with breakfast., Disp: , Rfl:  .  Cyanocobalamin (VITAMIN B 12 PO), Take every 30 (thirty) days by mouth. V b 12 injection every 2 weeks, Disp: , Rfl:  .  gabapentin (NEURONTIN) 600 MG tablet, Take 1 tablet (600 mg total) by mouth 3 (three) times daily., Disp: 90 tablet, Rfl: 3 .  guaiFENesin (MUCINEX) 600 MG 12 hr tablet, Take 600 mg by mouth 2 (two) times daily., Disp: , Rfl:  .  omeprazole (PRILOSEC) 40 MG capsule, Take 1 capsule (40 mg total) by mouth 2 (two) times daily., Disp: 60 capsule, Rfl: 3 .  Probiotic Product (PHILLIPS COLON HEALTH PO), Take 1 capsule by mouth daily., Disp: , Rfl:  .  promethazine-codeine (PHENERGAN WITH CODEINE) 6.25-10 MG/5ML syrup, , Disp: , Rfl:  .  rivaroxaban (XARELTO) 20 MG TABS tablet, Take 20 mg by mouth daily with supper., Disp: , Rfl:  .  tiZANidine (ZANAFLEX) 4 MG tablet, Take 4 mg by mouth every 6 (six) hours as needed for muscle spasms., Disp: , Rfl:  .  venlafaxine (EFFEXOR) 75 MG tablet, Take 75 mg by mouth 2 (two) times daily., Disp: , Rfl:  .  VITAMIN D, CHOLECALCIFEROL, PO, Take 1 capsule by mouth daily., Disp: , Rfl:  .  albuterol (PROVENTIL HFA;VENTOLIN HFA) 108 (90 Base) MCG/ACT inhaler, Inhale 2 puffs every 4 (four) hours as needed into the lungs for wheezing or shortness of breath. (Patient not taking: Reported on 01/29/2018), Disp: 1 Inhaler, Rfl: 3 .  meloxicam (MOBIC) 15 MG tablet, Take 15 mg by mouth daily., Disp: , Rfl:  .  Spacer/Aero Chamber Mouthpiece MISC, 1 Device as directed by Does not apply route. (Patient not taking:  Reported on 01/29/2018), Disp: 1 each, Rfl: 0

## 2018-01-29 NOTE — Patient Instructions (Signed)
Upper airway cough syndrome: Continue to take acid reflux treatment (omeprazole twice a day) Continue to take allergic rhinitis treatment: Cetirizine daily, Flonase nasal spray daily Add montelukast 10 mg daily  If your cough is worse on the next visit then we will talk to your primary care doctor and neurologist about switching you from venlafaxine to Elavil  We will see you back in 4-6 weeks with a nurse practitioner

## 2018-02-04 DIAGNOSIS — N950 Postmenopausal bleeding: Secondary | ICD-10-CM | POA: Diagnosis not present

## 2018-02-04 DIAGNOSIS — N949 Unspecified condition associated with female genital organs and menstrual cycle: Secondary | ICD-10-CM | POA: Diagnosis not present

## 2018-02-05 DIAGNOSIS — Z1212 Encounter for screening for malignant neoplasm of rectum: Secondary | ICD-10-CM | POA: Diagnosis not present

## 2018-02-06 DIAGNOSIS — E538 Deficiency of other specified B group vitamins: Secondary | ICD-10-CM | POA: Diagnosis not present

## 2018-02-26 ENCOUNTER — Ambulatory Visit (INDEPENDENT_AMBULATORY_CARE_PROVIDER_SITE_OTHER): Payer: Medicare Other | Admitting: Acute Care

## 2018-02-26 ENCOUNTER — Encounter: Payer: Self-pay | Admitting: Acute Care

## 2018-02-26 DIAGNOSIS — J3081 Allergic rhinitis due to animal (cat) (dog) hair and dander: Secondary | ICD-10-CM | POA: Diagnosis not present

## 2018-02-26 MED ORDER — GABAPENTIN 600 MG PO TABS: 600.0000 mg | ORAL_TABLET | Freq: Three times a day (TID) | ORAL | 1 refills | Status: AC

## 2018-02-26 MED ORDER — MONTELUKAST SODIUM 10 MG PO TABS: 10.0000 mg | ORAL_TABLET | Freq: Every day | ORAL | 1 refills | Status: AC

## 2018-02-26 NOTE — Patient Instructions (Addendum)
It is nice to meet you today. Continue Singulair daily  as you have been doing . Use rescue inhaler as needed for shortness of breath up to 4 times a day. Please let us know if you need a refill. Continue Robitussin for your cough Continue Prilosec 40 mg twice daily Continue neurontin 600 mg 3 times daily We will refill your prescription. Don't drive if you are sleepy. We will have Dr. Kendrick FriesMcQuaid recommend a pulmonary doctor in [place] Follow up as needed. Please contact office for sooner follow up if symptoms do not improve or worsen or seek emergency care

## 2018-02-26 NOTE — Progress Notes (Signed)
History of Present Illness Allison Burnett is a 67 y.o. female with cough in setting of allergic rhinitis and gastroesophageal reflux disease, and moderate to persistent asthma.. She was previously followed by [name]. She was last seen by [name]  HPI 67 y.o. female with moderate persistent asthma based on the fact that she does have a continued daily cough and nocturnal awakenings. Patient denies any previous symptom control with brief exposure to Qvar. I doubt the patient has an underlying neurogenic cough given that she is on 1200 mg of Neurontin. With her chronic use of Zoloft I believe it unsafe to attempt treatment with Ultram for cough suppression. She has no symptoms that would suggest reflux and previous esophagram did not demonstrate any either. Given the seasonal variability to her cough this seems to be most consistent with her underlying abnormal bronchial hyperreactivity. Overall her chronic allergic rhinitis seems to be reasonably well controlled with her over-the-counter antihistamine therapy.  1. Moderate persistent asthma: Prescribing the patient albuterol inhaler to use as needed. Given samples of Symbicort 80/4.5 with spacer today & instructed on proper oral hygiene. 2. Chronic allergic rhinitis: Continuing over-the-counter antihistamine therapy. No new medications. 3. Health maintenance: Status post Pneumovax November 2017 & Flu Vaccine October 2018. Believes she had a "pneumonia" vaccine last month as well - likely Prevnar.     02/26/2018 4-6 week follow up per [name]  Pt. Was seen by [name] 01/29/2018. Below is what she told him regarding her cough, PND and GERD.Marland Kitchen  Cough: > she had no benefit off or on Symbicort > she says that she will have a tickling in her throat and will just have a severe presistent coughing spell > no mucus production with her cough > she tried voice rest for three days, she says that she is not sure if the cough got better or not;  the cough was better during  > Robitussin DM helps her cough  Post nasal drip: > she is taking the nasacort twice a day > she takes robitussin DM for congestion   GERD: > she takes omeprazole 59m twice a day > she has never been on an antacid  Discussion:( Dr. MLake Bells She has upper airway cough syndrome from acid reflux, allergic rhinitis, and ongoing laryngeal irritation.  She has this despite taking a high dose of Neurontin.  I wonder whether or not she should be switched to Elavil.  Because of ongoing allergic rhinitis symptoms.  I am going to add montelukast to see if this helps.  Exhaled NO testing 01/29/2018 was normal.( 18 ppb)   Plan: Upper airway cough syndrome: Continue to take acid reflux treatment (omeprazole twice a day) Continue to take allergic rhinitis treatment: Cetirizine daily, Flonase nasal spray daily Add montelukast 10 mg daily  If your cough is worse on the next visit then we will talk to your primary care doctor and neurologist about switching you from venlafaxine ( Effexor)  to Elavil  Pt. Returns today 02/26/2018 stating she has been compliant with addition of her Singulair,Nasocort, Robitussin, and protonix. She states the addition of the Singulair has provided some relief, but the cough is not gone. She states she is not coughing as much, but when she does cough it is severe. She states she is not coughing as often. She states that the cough remains non-productive.She states she does have some dyspnea with activity.She is in the process of moving and she has noticed that with true exertion she does  have some dyspnea. She feels she did not notice this before because she does not normally exert herself like she has been doing lately. Pt. States she prefers to continue on the Singulair for now vs changing her anti-depressant. She is moving to [place] on Saturday. She will need a physician recommendation for continued follow up and care.She denies fever, chest  pain, orthopnea or hemoptysis.   Test Results: PFT 06/25/16: FVC 2.39 L (78%) FEV1 1.91 L (82%) FEV1/FVC 0.80 FEF 25-75 1.83 L (89%) no bronchodilator response TLC 4.49 L (91%) RV 101% ERV 25% DLC uncorrected 79%  METHACHOLINE CHALLENGE TESTING (08/21/16):FEV1/FVC 0.80 pre-methacholine challenge. 33% reduction in FEV1 at level 5 dilution. Significant bronchodilator response subsequent.  IMAGING BARIUM SWALLOW 08/21/16 (per radiologist):No evidence of mass, stricture, mucosal ulceration, or esophageal dysmotility.  MAXILLOFACIAL CT W/O 08/15/16 (per radiologist):No sinusitis.  CXR PA/LAT 06/18/16 (previously reviewed by me):No focal opacity or nodule appreciated. No pleural effusion. Heart normal in size &mediastinum normal in contour.  LABS 08/14/16 ESR: 17 CRP: 1.3 IgE: 49 RAST Panel: Cat Dander0.44 ANA: Negative Rheumatoid factor: <10 Anti-CCP: <16  CBC 12/15/2007  WBC 7.8  Hemoglobin 12.5  Hematocrit 36.8  Platelets 489(H)    BMP 12/15/2007  Glucose 85  BUN 7  Creatinine 0.72  Sodium 137  Potassium 4.9  Chloride 107  CO2 26  Calcium 9.6    BNP No results found for: BNP  ProBNP No results found for: PROBNP  PFT    Component Value Date/Time   FEV1PRE 2.02 08/21/2016 1143   FEV1POST 1.65 08/21/2016 1143   FVCPRE 2.51 08/21/2016 1143   FVCPOST 2.20 08/21/2016 1143   TLC 4.49 06/19/2016 0824   DLCOUNC 18.10 06/19/2016 0824   PREFEV1FVCRT 80 08/21/2016 1143   PSTFEV1FVCRT 75 08/21/2016 1143    No results found.   Past medical hx Past Medical History:  Diagnosis Date  . Arthritis   . Atrial fibrillation ([place])   . DJD (degenerative joint disease)    bulging disc lower back  . Headache(784.0)    migraines  . PONV (postoperative nausea and vomiting) Jan 04, 2014   woke up during attempted colonscopy at [facility]  . Seizures ([place])    none since age 50, childhood only     Social History   Tobacco Use  . Smoking status: Passive Smoke  Exposure - Never Smoker  . Smokeless tobacco: Never Used  . Tobacco comment: Parents until 35 y.o. as well as other family members  & husband  Substance Use Topics  . Alcohol use: Yes    Comment: occasional  . Drug use: No    Ms.Kitko reports that she is a non-smoker but has been exposed to tobacco smoke. she has never used smokeless tobacco. She reports that she drinks alcohol. She reports that she does not use drugs.  Tobacco Cessation: Never smoker Second hand smoke   Past surgical hx, Family hx, Social hx all reviewed.  Current Outpatient Medications on File Prior to Visit  Medication Sig  . albuterol (PROVENTIL HFA;VENTOLIN HFA) 108 (90 Base) MCG/ACT inhaler Inhale 2 puffs every 4 (four) hours as needed into the lungs for wheezing or shortness of breath.  . Ascorbic Acid (VITAMIN C) 1000 MG tablet Take 1,000 mg by mouth daily.  . calcium carbonate (CALCIUM 600) 600 MG TABS tablet Take 1,200 mg by mouth daily with breakfast.  . Cyanocobalamin (VITAMIN B 12 PO) Take every 30 (thirty) days by mouth. V b 12 injection every 2  weeks  . guaiFENesin (MUCINEX) 600 MG 12 hr tablet Take 600 mg by mouth 2 (two) times daily.  . meloxicam (MOBIC) 15 MG tablet Take 15 mg by mouth daily.  Marland Kitchen omeprazole (PRILOSEC) 40 MG capsule Take 1 capsule (40 mg total) by mouth 2 (two) times daily.  . Probiotic Product (PHILLIPS COLON HEALTH PO) Take 1 capsule by mouth daily.  . promethazine-codeine (PHENERGAN WITH CODEINE) 6.25-10 MG/5ML syrup   . rivaroxaban (XARELTO) 20 MG TABS tablet Take 20 mg by mouth daily with supper.  Marland Kitchen tiZANidine (ZANAFLEX) 4 MG tablet Take 4 mg by mouth every 6 (six) hours as needed for muscle spasms.  Marland Kitchen venlafaxine (EFFEXOR) 75 MG tablet Take 75 mg by mouth 2 (two) times daily.  Marland Kitchen VITAMIN D, CHOLECALCIFEROL, PO Take 1 capsule by mouth daily.   No current facility-administered medications on file prior to visit.      Allergies  Allergen Reactions  . Mysoline [Primidone] Nausea  And Vomiting  . Dilantin [Phenytoin Sodium Extended] Rash  . Phenobarbital Rash  . Sulfa Antibiotics Rash    Review Of Systems:  Constitutional:   No  weight loss, night sweats,  Fevers, chills, fatigue, or  lassitude.  HEENT:   No headaches,  Difficulty swallowing,  Tooth/dental problems, or  Sore throat,                No sneezing, itching, ear ache, nasal congestion, post nasal drip,   CV:  No chest pain,  Orthopnea, PND, swelling in lower extremities, anasarca, dizziness, palpitations, syncope.   GI  No heartburn, indigestion, abdominal pain, nausea, vomiting, diarrhea, change in bowel habits, loss of appetite, bloody stools.   Resp: + shortness of breath with exertion less at rest.  No excess mucus, no productive cough,  + non-productive cough,  No coughing up of blood.  No change in color of mucus.  No wheezing.  No chest wall deformity  Skin: no rash or lesions.  GU: no dysuria, change in color of urine, no urgency or frequency.  No flank pain, no hematuria   MS:  No joint pain or swelling.  No decreased range of motion.  No back pain.  Psych:  No change in mood or affect. No depression or anxiety.  No memory loss.   Vital Signs BP (!) 148/90 (BP Location: Left Arm, Cuff Size: Normal)   Pulse 90   Ht _0  (1.6 m)   Wt 185 lb 3.2 oz (84 kg)   SpO2 95%   BMI 32.81 kg/m    Physical Exam:  General- No distress,  A&Ox3, pleasant ENT: No sinus tenderness, TM clear, pale nasal mucosa, no oral exudate,+ post nasal drip, no LAN Cardiac: S1, S2, regular rate and rhythm, no murmur Chest: No wheeze/ rales/ dullness; no accessory muscle use, no nasal flaring, no sternal retractions Abd.: Soft Non-tender, obese Ext: No clubbing cyanosis, edema Neuro:  normal strength, deconditioned, MAE x 4, A&O x 3 Skin: No rashes, warm and dry Psych: normal mood and behavior   Assessment/Plan  Chronic allergic rhinitis due to animal hair and dander Cough is etter with Singulair Is  moving to [place] this week. Plan: Continue Singulair daily  as you have been doing . Use rescue inhaler as needed for shortness of breath up to 4 times a day. Please let us know if you need a refill. Continue Robitussin for your cough Continue Prilosec 40 mg twice daily Continue neurontin 600 mg 3 times daily We will refill  your prescription. Don't drive if you are sleepy. We will have [name] recommend a pulmonary doctor in [place]. Follow up as needed. Please contact office for sooner follow up if symptoms do not improve or worsen or seek emergency care        Magdalen Spatz, NP 02/26/2018  8:05 PM

## 2018-02-26 NOTE — Assessment & Plan Note (Signed)
Cough is etter with Singulair Is moving to [place] this week. Plan: Continue Singulair daily  as you have been doing . Use rescue inhaler as needed for shortness of breath up to 4 times a day. Please let us know if you need a refill. Continue Robitussin for your cough Continue Prilosec 40 mg twice daily Continue neurontin 600 mg 3 times daily We will refill your prescription. Don't drive if you are sleepy. We will have Dr. Kendrick FriesMcQuaid recommend a pulmonary doctor in [place] Follow up as needed. Please contact office for sooner follow up if symptoms do not improve or worsen or seek emergency care

## 2018-03-11 DIAGNOSIS — E538 Deficiency of other specified B group vitamins: Secondary | ICD-10-CM | POA: Diagnosis not present

## 2018-04-22 DIAGNOSIS — E538 Deficiency of other specified B group vitamins: Secondary | ICD-10-CM | POA: Diagnosis not present

## 2018-04-22 DIAGNOSIS — F329 Major depressive disorder, single episode, unspecified: Secondary | ICD-10-CM | POA: Diagnosis not present

## 2018-04-22 DIAGNOSIS — N950 Postmenopausal bleeding: Secondary | ICD-10-CM | POA: Diagnosis not present

## 2018-04-22 DIAGNOSIS — I4891 Unspecified atrial fibrillation: Secondary | ICD-10-CM | POA: Diagnosis not present

## 2018-09-05 DIAGNOSIS — K219 Gastro-esophageal reflux disease without esophagitis: Secondary | ICD-10-CM | POA: Diagnosis not present

## 2018-09-05 DIAGNOSIS — F329 Major depressive disorder, single episode, unspecified: Secondary | ICD-10-CM | POA: Diagnosis not present

## 2018-09-05 DIAGNOSIS — G2581 Restless legs syndrome: Secondary | ICD-10-CM | POA: Diagnosis not present

## 2018-09-05 DIAGNOSIS — E538 Deficiency of other specified B group vitamins: Secondary | ICD-10-CM | POA: Diagnosis not present

## 2018-09-05 DIAGNOSIS — G43909 Migraine, unspecified, not intractable, without status migrainosus: Secondary | ICD-10-CM | POA: Diagnosis not present

## 2018-09-24 DIAGNOSIS — E538 Deficiency of other specified B group vitamins: Secondary | ICD-10-CM | POA: Diagnosis not present

## 2018-10-09 DIAGNOSIS — Z23 Encounter for immunization: Secondary | ICD-10-CM | POA: Diagnosis not present

## 2018-10-24 DIAGNOSIS — T148XXA Other injury of unspecified body region, initial encounter: Secondary | ICD-10-CM | POA: Diagnosis not present

## 2018-10-24 DIAGNOSIS — E538 Deficiency of other specified B group vitamins: Secondary | ICD-10-CM | POA: Diagnosis not present

## 2018-10-30 DIAGNOSIS — E538 Deficiency of other specified B group vitamins: Secondary | ICD-10-CM | POA: Diagnosis not present

## 2018-11-06 DIAGNOSIS — E538 Deficiency of other specified B group vitamins: Secondary | ICD-10-CM | POA: Diagnosis not present

## 2018-11-13 DIAGNOSIS — E538 Deficiency of other specified B group vitamins: Secondary | ICD-10-CM | POA: Diagnosis not present

## 2018-11-19 DIAGNOSIS — E538 Deficiency of other specified B group vitamins: Secondary | ICD-10-CM | POA: Diagnosis not present

## 2018-12-01 DIAGNOSIS — J069 Acute upper respiratory infection, unspecified: Secondary | ICD-10-CM | POA: Diagnosis not present

## 2018-12-01 DIAGNOSIS — K219 Gastro-esophageal reflux disease without esophagitis: Secondary | ICD-10-CM | POA: Diagnosis not present

## 2018-12-01 DIAGNOSIS — F329 Major depressive disorder, single episode, unspecified: Secondary | ICD-10-CM | POA: Diagnosis not present

## 2018-12-01 DIAGNOSIS — E538 Deficiency of other specified B group vitamins: Secondary | ICD-10-CM | POA: Diagnosis not present

## 2018-12-09 DIAGNOSIS — E538 Deficiency of other specified B group vitamins: Secondary | ICD-10-CM | POA: Diagnosis not present

## 2018-12-16 DIAGNOSIS — Z1231 Encounter for screening mammogram for malignant neoplasm of breast: Secondary | ICD-10-CM | POA: Diagnosis not present

## 2018-12-22 DIAGNOSIS — E538 Deficiency of other specified B group vitamins: Secondary | ICD-10-CM | POA: Diagnosis not present

## 2018-12-29 IMAGING — CT CT L SPINE W/ CM
1 of 6 series · 6 of 14 positions shown, 8 images · non-contrast
Comparison: MRI lumbar spine 02/18/2017.

CLINICAL DATA: Low back pain.  LEFT leg pain.
TECHNIQUE: Contiguous axial images were obtained through the Lumbar spine after
the intrathecal infusion of infusion. Coronal and sagittal
reconstructions were obtained of the axial image sets.

[Series 2: l spine soft · axial · 0.30mm/px · z∈[-284,-132]mm · 6 of 73 slices shown, 8 images]
[im 11/73  soft-tissue]
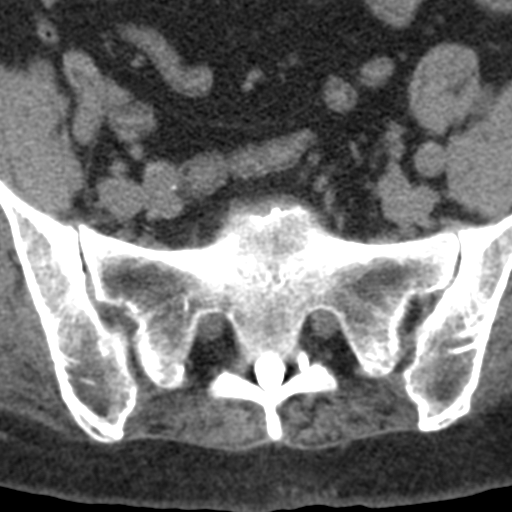
[im 11/73  bone]
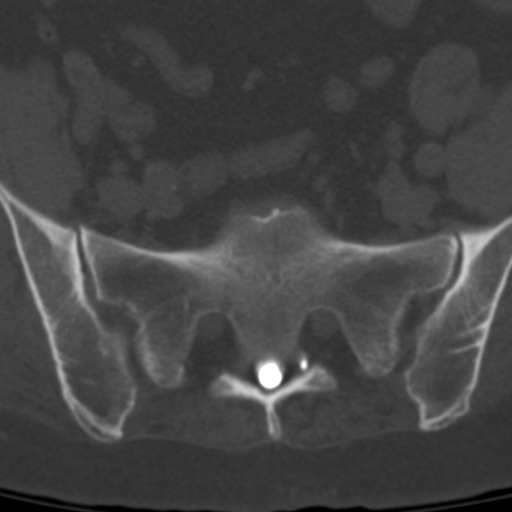
[im 21/73  bone]
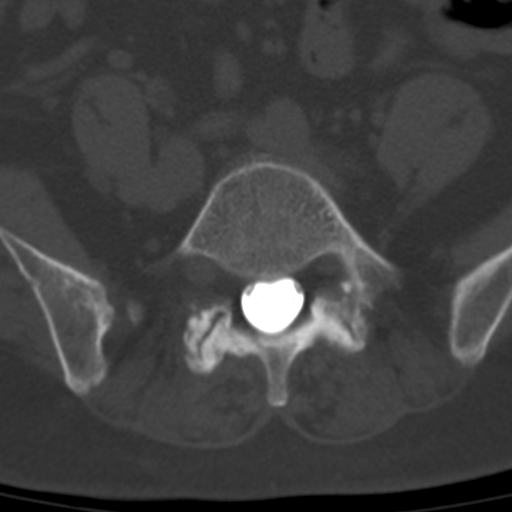
[im 31/73  bone]
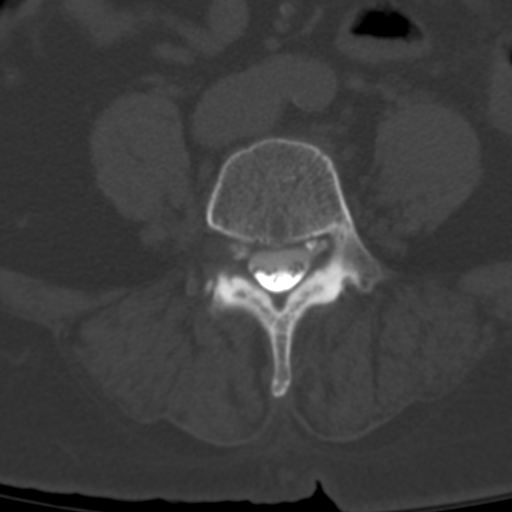
[im 42/73  bone]
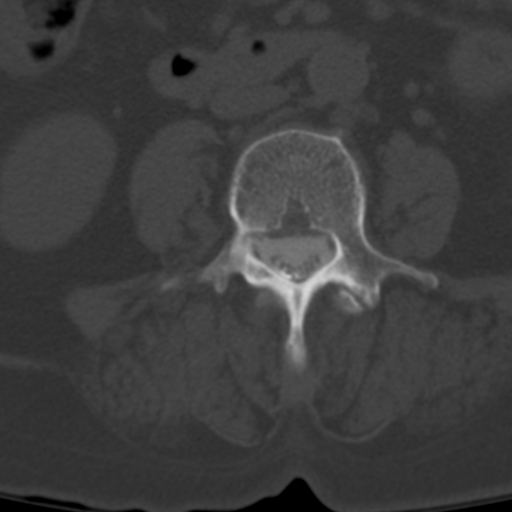
[im 52/73  soft-tissue]
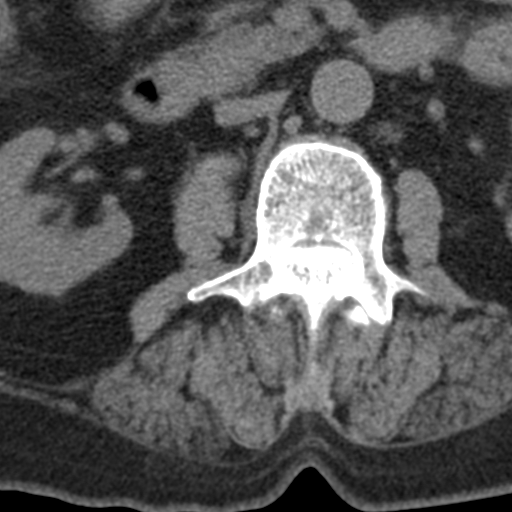
[im 52/73  bone]
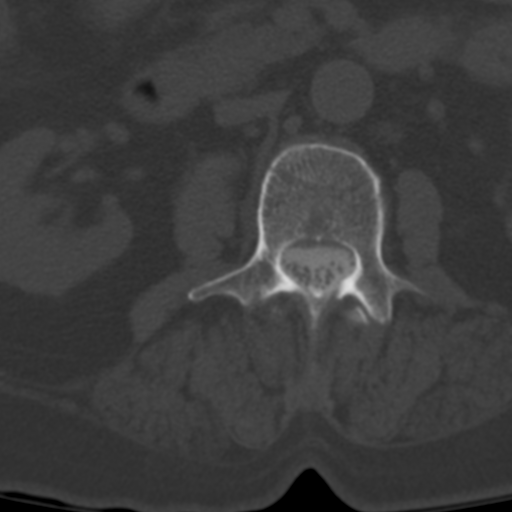
[im 62/73  bone]
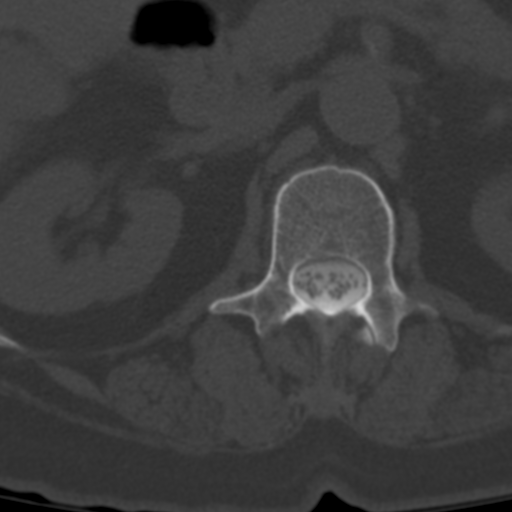

[6 of 14 positions shown; findings below may reference images not displayed]

EXAM:
LUMBAR MYELOGRAM

FLUOROSCOPY TIME:  dictate in minutes and seconds

PROCEDURE:
After thorough discussion of risks and benefits of the procedure
including bleeding, infection, injury to nerves, blood vessels,
adjacent structures as well as headache and CSF leak, written and
oral informed consent was obtained. Consent was obtained by Dr. Alexandru Marius
Ferienhaus. Time out form was completed.

Patient was positioned prone on the fluoroscopy table. Local
anesthesia was provided with 1% lidocaine without epinephrine after
prepped and draped in the usual sterile fashion. Puncture was
performed at L4-5 using a 3 1/2 inch 22-gauge spinal needle via
RIGHT paramedian approach. Using a single pass through the dura, the
needle was placed within the thecal sac, with return of clear CSF.
15 mL of Isovue-M 200 was injected into the thecal sac, with normal
opacification of the nerve roots and cauda equina consistent with
free flow within the subarachnoid space.

I personally performed the lumbar puncture and administered the
intrathecal contrast. I also personally supervised acquisition of
the myelogram images.
FINDINGS: LUMBAR MYELOGRAM FINDINGS:

Good opacification lumbar subarachnoid space. Mild to moderate
stenosis at the L4-5 level related to ventral disc material and
posterior element hypertrophy. Mild BILATERAL L5 nerve root
impingement. Similar moderate stenosis at L3-4 with LEFT greater
than RIGHT L4 nerve root impingement. Mild to moderate stenosis at
L2-3 with RIGHT greater than LEFT L3 nerve root impingement.

With patient prone for myelography, there is essentially anatomic
alignment.

With patient standing, asymmetric loss of interspace height at L2-3
on the RIGHT is apparent, with minimal degenerative scoliosis to the
LEFT. With patient upright in neutral, there is 2 mm anterolisthesis
L3-4 and 1 mm anterolisthesis L5-S1. With patient standing in
flexion, there is 3 mm anterolisthesis L3-4, 2 mm anterolisthesis
L4-5, and 2 mm anterolisthesis L5-S1. With patient standing in
extension, there is 2 mm anterolisthesis L3-4, 1 mm anterolisthesis
L4-5, and 1 mm anterolisthesis L5-S1.

CT LUMBAR MYELOGRAM FINDINGS:

Segmentation: Normal.

Alignment: 2 mm anterolisthesis L3-4. 1 mm anterolisthesis L5-S1.
Both are facet mediated.

Vertebrae: No worrisome osseous lesion.

Conus medullaris: Normal in size and location ending at L1.

Paraspinal tissues: No evidence for hydronephrosis or paravertebral
mass.

Disc levels:

L1-L2:  Shallow protrusion.  No impingement.

L2-L3: Central and rightward protrusion. Osseous spurring also
extends to the RIGHT with asymmetric loss of interspace height.
RIGHT L2 and RIGHT L3 nerve root impingement are evident.

L3-L4: 2 mm anterolisthesis. Central and leftward protrusion. Mild
stenosis. Asymmetric LEFT facet arthropathy. Disc material also
extends into the foramen. LEFT greater than RIGHT L3 and L4 nerve
root impingement.

L4-L5: Central protrusion. Posterior element hypertrophy. Mild
stenosis. Mild BILATERAL L5 nerve root effacement. Slight foraminal
narrowing does not clearly affect the L4 nerve roots.

L5-S1: 1 mm anterolisthesis. Facet arthropathy. Annular bulging. No
impingement.

Compared to prior MR, good general agreement.
IMPRESSION: LUMBAR MYELOGRAM IMPRESSION:

The dominant LEFT-sided abnormality on myelography is at L3-4. LEFT
L4 nerve root effacement is seen.

Lumbar myelography demonstrates only minor dynamic instability, most
pronounced at L3-4 with patient standing in flexion up to 3 mm.

CT LUMBAR MYELOGRAM IMPRESSION:

2 mm anterolisthesis at L3-4 with patient recumbent for CT. Central
and leftward protrusion. Asymmetric LEFT-sided facet arthropathy
with disc material extending into the foramen. LEFT greater than
RIGHT L3 and L4 nerve root impingement.

Central protrusion at L4-5, with mild effacement of both L5 nerve
roots.

Central and rightward protrusion at L2-3. Uncertain significance of
RIGHT L2 and RIGHT L3 nerve root impingement.

## 2019-01-01 DIAGNOSIS — Z1231 Encounter for screening mammogram for malignant neoplasm of breast: Secondary | ICD-10-CM | POA: Diagnosis not present

## 2019-01-02 DIAGNOSIS — N6489 Other specified disorders of breast: Secondary | ICD-10-CM | POA: Diagnosis not present

## 2019-01-02 DIAGNOSIS — R928 Other abnormal and inconclusive findings on diagnostic imaging of breast: Secondary | ICD-10-CM | POA: Diagnosis not present

## 2019-01-05 DIAGNOSIS — E538 Deficiency of other specified B group vitamins: Secondary | ICD-10-CM | POA: Diagnosis not present

## 2019-01-14 DIAGNOSIS — M62830 Muscle spasm of back: Secondary | ICD-10-CM | POA: Diagnosis not present

## 2019-01-19 DIAGNOSIS — E538 Deficiency of other specified B group vitamins: Secondary | ICD-10-CM | POA: Diagnosis not present

## 2019-01-30 DIAGNOSIS — G43909 Migraine, unspecified, not intractable, without status migrainosus: Secondary | ICD-10-CM | POA: Diagnosis not present

## 2019-02-02 DIAGNOSIS — E538 Deficiency of other specified B group vitamins: Secondary | ICD-10-CM | POA: Diagnosis not present

## 2019-02-16 DIAGNOSIS — E538 Deficiency of other specified B group vitamins: Secondary | ICD-10-CM | POA: Diagnosis not present

## 2019-03-02 DIAGNOSIS — E538 Deficiency of other specified B group vitamins: Secondary | ICD-10-CM | POA: Diagnosis not present

## 2019-03-05 DIAGNOSIS — E538 Deficiency of other specified B group vitamins: Secondary | ICD-10-CM | POA: Diagnosis not present

## 2019-03-05 DIAGNOSIS — R200 Anesthesia of skin: Secondary | ICD-10-CM | POA: Diagnosis not present

## 2019-03-05 DIAGNOSIS — G2581 Restless legs syndrome: Secondary | ICD-10-CM | POA: Diagnosis not present

## 2019-03-16 DIAGNOSIS — E538 Deficiency of other specified B group vitamins: Secondary | ICD-10-CM | POA: Diagnosis not present

## 2019-03-17 DIAGNOSIS — G629 Polyneuropathy, unspecified: Secondary | ICD-10-CM | POA: Diagnosis not present

## 2019-03-17 DIAGNOSIS — R200 Anesthesia of skin: Secondary | ICD-10-CM | POA: Diagnosis not present

## 2019-09-16 IMAGING — US US PELVIS COMPLETE TRANSABD/TRANSVAG
1 series · 13 of 25 positions shown · non-contrast
Comparison: None

CLINICAL DATA: Postmenopausal bleeding.  Hysterectomy in 0765

EXAM:
TRANSABDOMINAL AND TRANSVAGINAL ULTRASOUND OF PELVIS
TECHNIQUE: Both transabdominal and transvaginal ultrasound examinations of the
pelvis were performed. Transabdominal technique was performed for
global imaging of the pelvis including uterus, ovaries, adnexal
regions, and pelvic cul-de-sac. It was necessary to proceed with
endovaginal exam following the transabdominal exam to visualize the
vaginal cuff and ovaries.

[Series 1: us pelvis complete transabd/transvag · 0.38mm/px · 31 acquisitions, 13 frames shown]
[im 1/31]
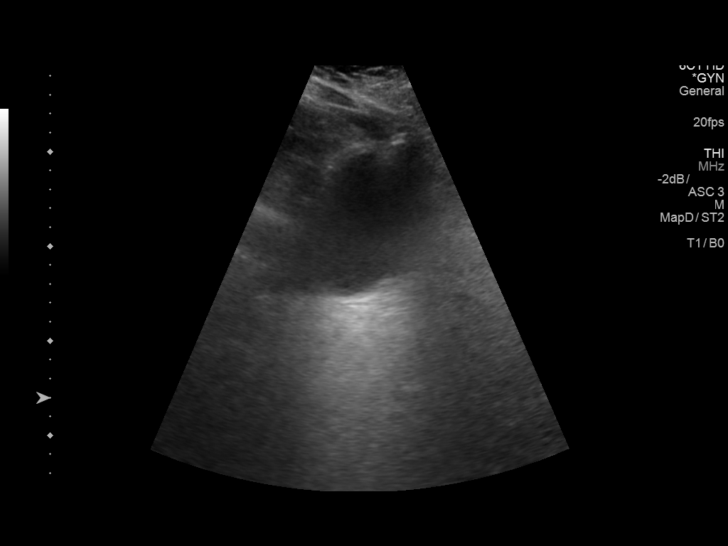
[im 3/31]
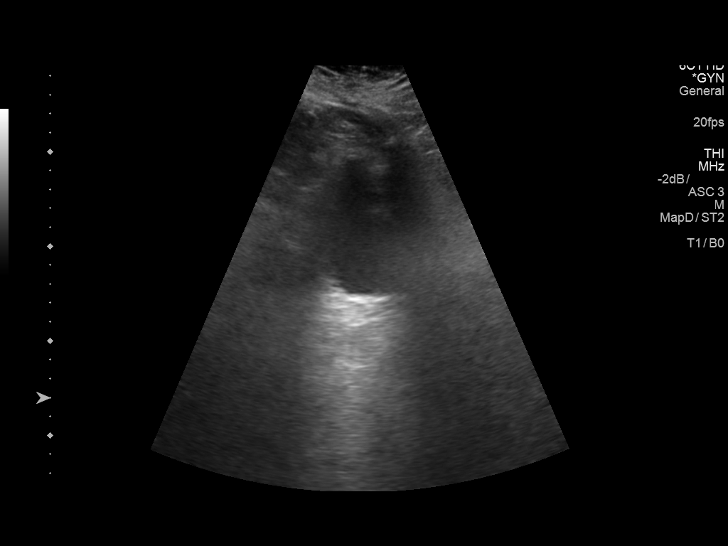
[im 6/31]
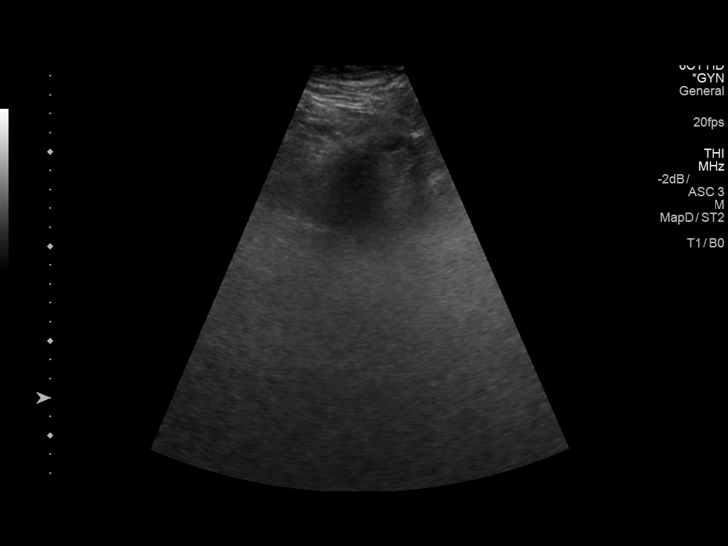
[im 8/31]
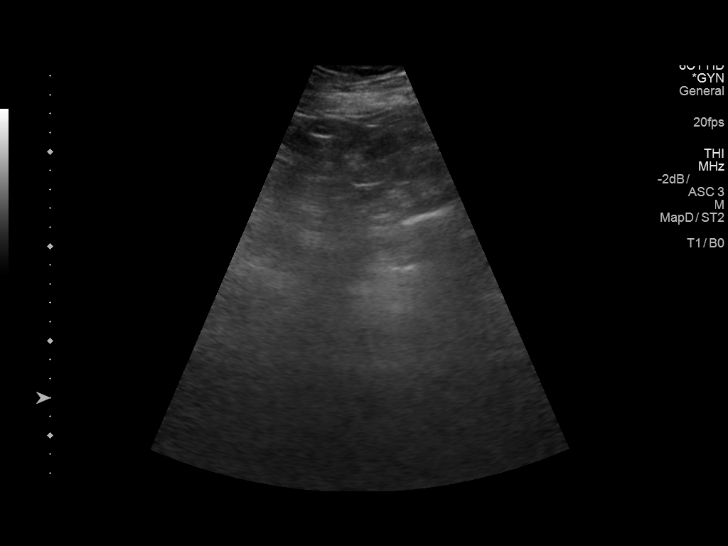
[im 11/31]
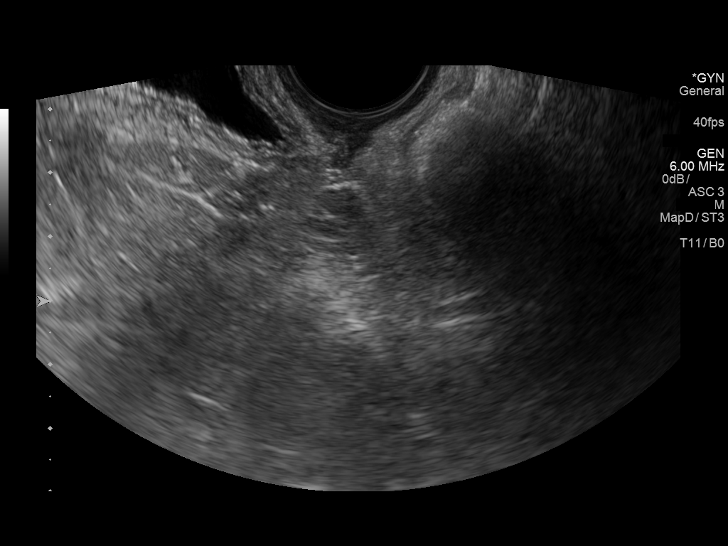
[im 13/31]
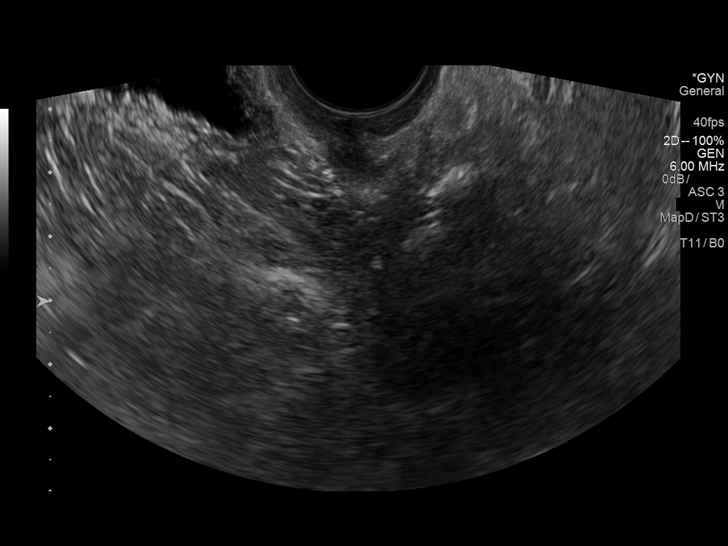
[im 16/31]
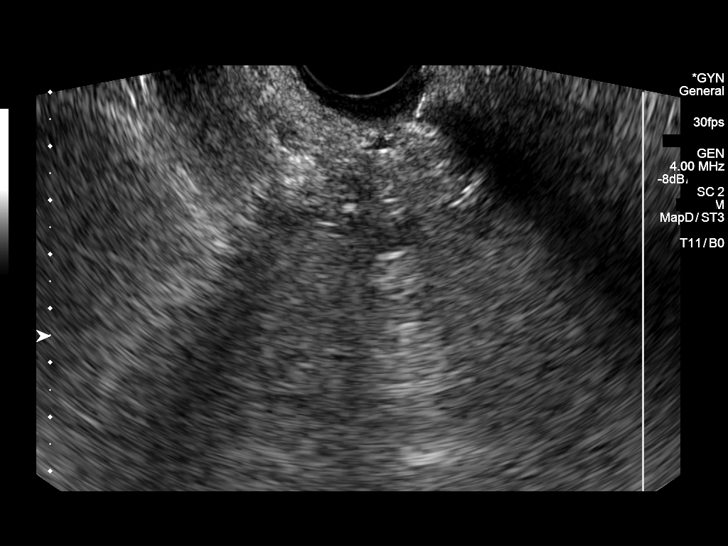
[im 18/31]
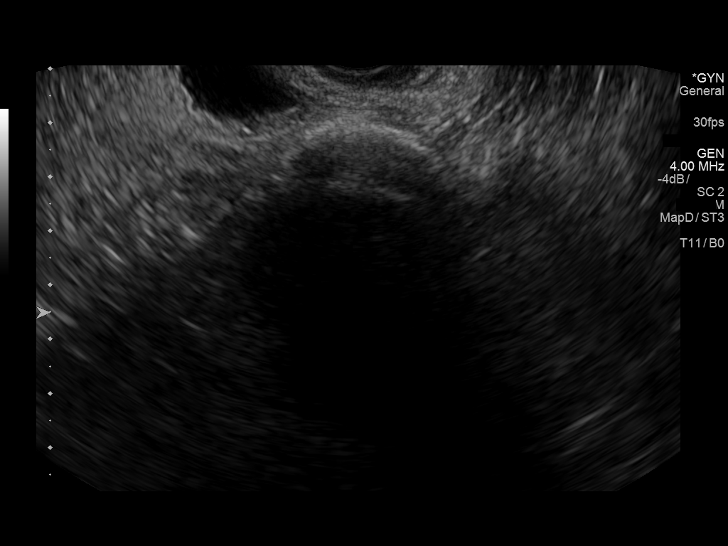
[im 21/31]
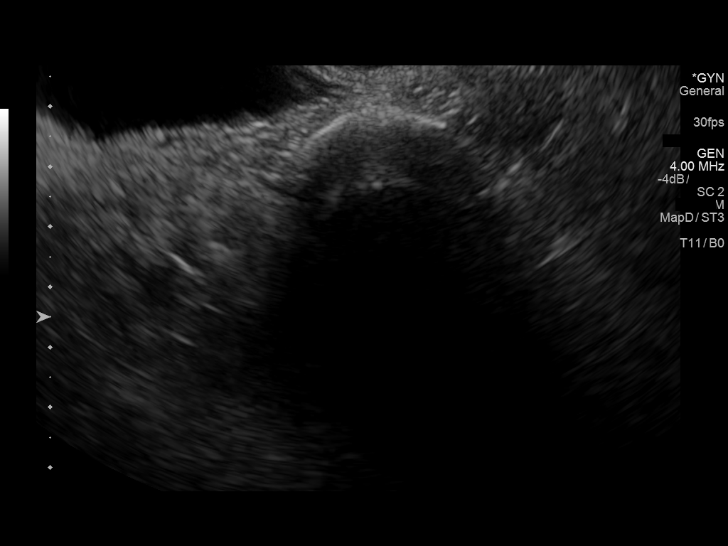
[im 23/31]
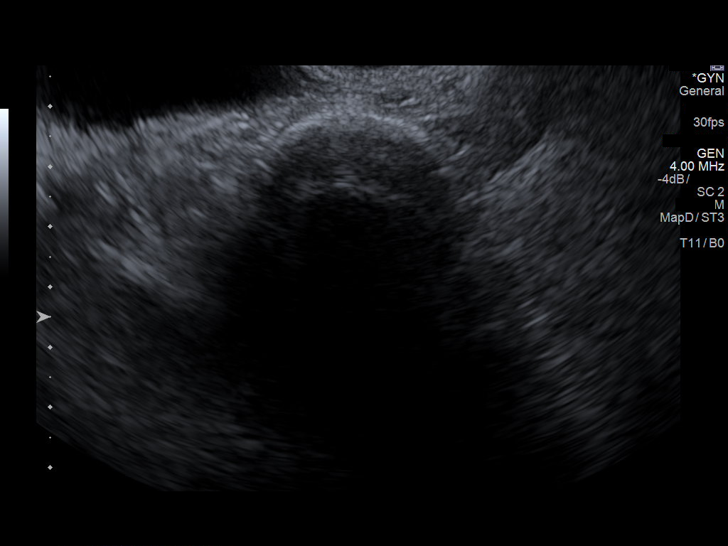
[im 26/31]
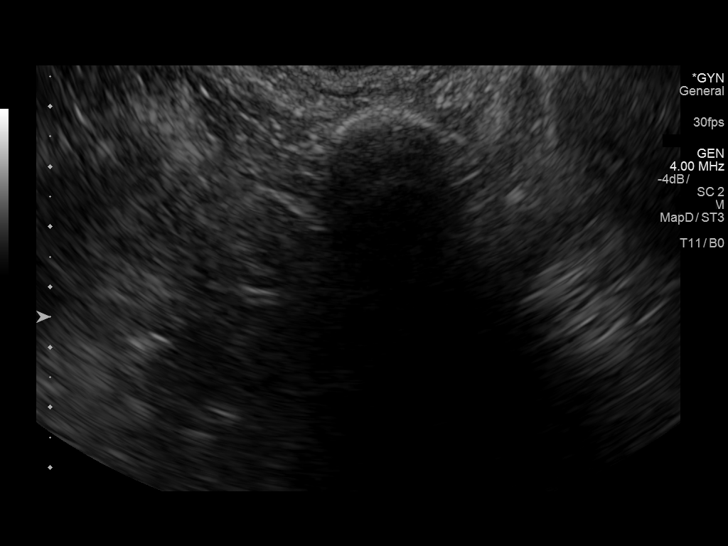
[im 28/31]
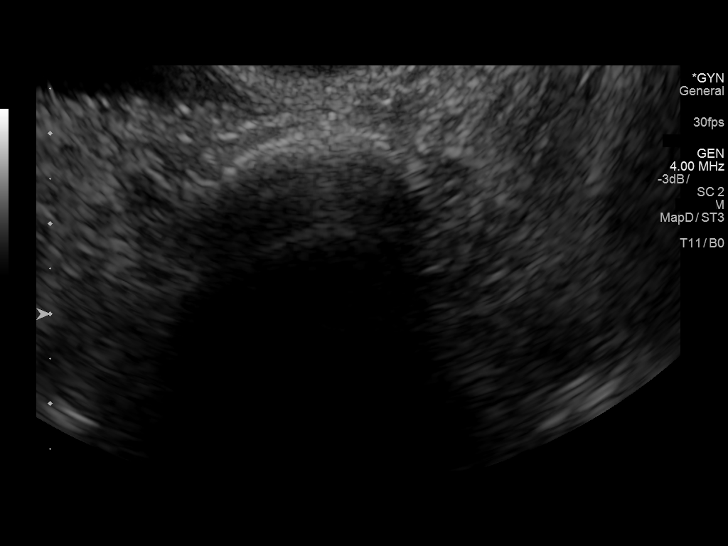
[im 31/31]
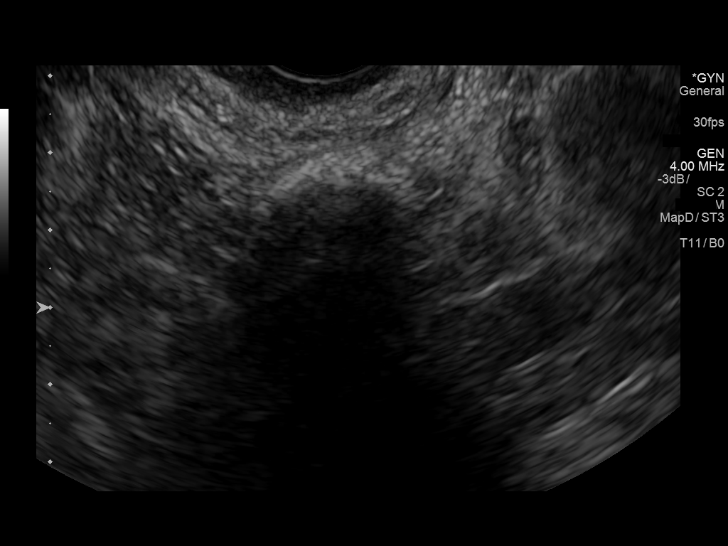

[13 of 25 positions shown; findings below may reference images not displayed]

FINDINGS: Uterus

Measurements: Surgically absent. Unremarkable appearance of vaginal
cuff.

Endometrium

Thickness: Surgically absent.

Right ovary

Measurements: Not directly visualized, however no adnexal mass
identified.

Left ovary

Measurements: No normal appearing ovary visualized. A mass is seen
the inferior left adnexal region which shows calcification. This
mass is incompletely visualized due to acoustic shadowing from
calcification, measures approximately 3.5 cm.

Other findings

No abnormal free fluid.
IMPRESSION: Prior hysterectomy.

Calcified mass in inferior left adnexal region measuring
approximately 3.5 cm. Differential diagnosis includes a broad
ligament fibroid and ovarian dermoid or other neoplasm. Recommend
pelvic MRI without and with contrast for further evaluation.
